# Patient Record
Sex: Male | Born: 1990 | Race: Black or African American | Hispanic: No | Marital: Single | Smoking: Former smoker
Health system: Southern US, Community
[De-identification: ages and names within clinical notes are randomized; demographics above are authoritative.]

## PROBLEM LIST (undated history)

## (undated) DIAGNOSIS — B20 Human immunodeficiency virus [HIV] disease: Secondary | ICD-10-CM

## (undated) DIAGNOSIS — H409 Unspecified glaucoma: Secondary | ICD-10-CM

## (undated) DIAGNOSIS — F329 Major depressive disorder, single episode, unspecified: Secondary | ICD-10-CM

## (undated) HISTORY — DX: Human immunodeficiency virus (HIV) disease: B20

## (undated) HISTORY — DX: Unspecified glaucoma: H40.9

## (undated) HISTORY — PX: NO PAST SURGERIES: SHX2092

## (undated) HISTORY — DX: Major depressive disorder, single episode, unspecified: F32.9

---

## 2014-05-13 ENCOUNTER — Emergency Department (HOSPITAL_COMMUNITY)
Admission: EM | Admit: 2014-05-13 | Discharge: 2014-05-13 | Discharge status: Absent without leave | Payer: Self-pay | Source: Home / Self Care

## 2016-09-22 ENCOUNTER — Other Ambulatory Visit: Payer: Self-pay | Admitting: Infectious Diseases

## 2016-09-22 ENCOUNTER — Ambulatory Visit (INDEPENDENT_AMBULATORY_CARE_PROVIDER_SITE_OTHER): Payer: Self-pay | Admitting: Licensed Clinical Social Worker

## 2016-09-22 ENCOUNTER — Encounter: Payer: Self-pay | Admitting: Infectious Diseases

## 2016-09-22 ENCOUNTER — Ambulatory Visit (INDEPENDENT_AMBULATORY_CARE_PROVIDER_SITE_OTHER): Payer: Self-pay | Admitting: Infectious Diseases

## 2016-09-22 VITALS — BP 136/88 | HR 69 | Temp 98.4°F | Ht 73.0 in

## 2016-09-22 DIAGNOSIS — F4321 Adjustment disorder with depressed mood: Secondary | ICD-10-CM

## 2016-09-22 DIAGNOSIS — B20 Human immunodeficiency virus [HIV] disease: Secondary | ICD-10-CM

## 2016-09-22 DIAGNOSIS — F329 Major depressive disorder, single episode, unspecified: Secondary | ICD-10-CM

## 2016-09-22 DIAGNOSIS — Z113 Encounter for screening for infections with a predominantly sexual mode of transmission: Secondary | ICD-10-CM

## 2016-09-22 DIAGNOSIS — Z0000 Encounter for general adult medical examination without abnormal findings: Secondary | ICD-10-CM

## 2016-09-22 DIAGNOSIS — F32A Depression, unspecified: Secondary | ICD-10-CM

## 2016-09-22 HISTORY — DX: Depression, unspecified: F32.A

## 2016-09-22 HISTORY — DX: Human immunodeficiency virus (HIV) disease: B20

## 2016-09-22 LAB — CBC WITH DIFFERENTIAL/PLATELET
BASOS ABS: 34 {cells}/uL (ref 0–200)
Basophils Relative: 1 %
EOS ABS: 204 {cells}/uL (ref 15–500)
EOS PCT: 6 %
HCT: 45.7 % (ref 38.5–50.0)
Hemoglobin: 15.8 g/dL (ref 13.2–17.1)
LYMPHS PCT: 38 %
Lymphs Abs: 1292 cells/uL (ref 850–3900)
MCH: 31.5 pg (ref 27.0–33.0)
MCHC: 34.6 g/dL (ref 32.0–36.0)
MCV: 91 fL (ref 80.0–100.0)
MONOS PCT: 10 %
MPV: 9.4 fL (ref 7.5–12.5)
Monocytes Absolute: 340 cells/uL (ref 200–950)
NEUTROS ABS: 1530 {cells}/uL (ref 1500–7800)
Neutrophils Relative %: 45 %
PLATELETS: 286 10*3/uL (ref 140–400)
RBC: 5.02 MIL/uL (ref 4.20–5.80)
RDW: 13.3 % (ref 11.0–15.0)
WBC: 3.4 10*3/uL — ABNORMAL LOW (ref 3.8–10.8)

## 2016-09-22 NOTE — Progress Notes (Signed)
Patient Active Problem List   Diagnosis Date Noted  . HIV (human immunodeficiency virus infection) ([place]) 09/22/2016  . Healthcare maintenance 09/22/2016  . Depression 09/22/2016    Patient's Medications   No medications on file    Subjective: Eric [name] is here today for his first visit for HIV care.   HIV = This is a new diagnosis for him. Received + Ab at [facility] on 08/31/16. He has had a very difficult time accepting this is an actual diagnosis and he is still hoping that it is false. Has been very tearful since receiving this news and withdrawn. Currently living with a few roommates, has family locally and has kept diagnosis to himself. Reports he had a negative test in Feb 2017; has had urinary symptoms lately that led him to recent STI screening. Repots no IVDU. 24 male sexual partners but has in the past engaged in oral sex with male partners but never insertive/receptive anal intercourse. Denies associated symptoms including fevers, chills, headaches, weight loss, SOB, abdominal pain / cramping, diarrhea, lymphadenopathy. Does recall about 4 months ago having had a flu-like illness that resolved.   Health Maintenance = reports he is UTD on childhood vaccines. 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   Review of Systems: Review of Systems  Constitutional: Negative for chills, fever, malaise/fatigue and weight loss.  HENT: Negative for sore throat.   Respiratory: Negative for cough, sputum production and shortness of breath.   Cardiovascular: Negative.   Gastrointestinal: Negative for abdominal pain, diarrhea and vomiting.  Musculoskeletal: Negative for joint pain, myalgias and neck pain.  Skin: Negative for rash.  Neurological: Negative for headaches.  Psychiatric/Behavioral: Positive for depression. Negative for substance abuse and suicidal ideas. The patient is not nervous/anxious.     Past Medical History:  Diagnosis Date  .  Depression 09/22/2016  . Glaucoma    currently untreated   . HIV (human immunodeficiency virus infection) ([place]) 09/22/2016    Social History  Substance Use Topics  . Smoking status: Never Smoker  . Smokeless tobacco: Never Used  . Alcohol use Yes     Comment: social    Family History  Problem Relation Age of Onset  . Hypertension Father   . Diabetes Father   . Healthy Brother   . Hypertension Maternal Grandmother   . Kidney disease Maternal Grandmother     Not on File   No current outpatient prescriptions on file.  Objective: Physical Exam  Constitutional: He is oriented to person, place, and time and well-developed, well-nourished, and in no distress.  Tearful throughout the visit.   HENT:  Mouth/Throat: No oral lesions. Normal dentition. No dental caries. No oropharyngeal exudate.  Eyes: No scleral icterus.  Cardiovascular: Normal rate, regular rhythm and normal heart sounds.   Pulmonary/Chest: Effort normal and breath sounds normal.  Abdominal: Soft. He exhibits no distension. There is no tenderness.  Lymphadenopathy:    He has no cervical adenopathy.  Neurological: He is alert and oriented to person, place, and time.  Skin: Skin is warm and dry. No rash noted.  Psychiatric: Mood and affect normal.   Vitals:   09/22/16 1520  BP: 136/88  Pulse: 69  Temp: 98.4 F (36.9 C)     Confirmatory HIV Ab testing reviewed.   I have reviewed all available documents of his medical record.    Assessment and Plan:  Problem List Items Addressed This Visit      Other  HIV (human immunodeficiency virus infection) ([place]) (Chronic)    New patient here to establish for HIV care. I discussed with Bryan Lemma treatment options/side effects, benefits of treatment and long-term outcomes. I discussed how HIV is transmitted and the process of untreated HIV including increased risk for opportunistic infections, cancer, dementia and renal failure. He was counseled on routine HIV  care including medication adherence, blood monitoring, necessary vaccines and follow up visits. Counseled regarding safe sex practices including: condom use, partner disclosure, limiting partners.   He has met with Sharyn Lull for ADAP application. He is not ready to start on ART at this time. Will check labs today and have him come back in 3 weeks for review and continue education / support. Would consider Biktarvy for him in the future.   I spent greater than 45 minutes with the patient today. Greater than 50% of the time spent face-to-face counseling and coordination of care re: HIV and health maintenance.        Relevant Orders   HIV-1 RNA ultraquant reflex to gentyp+   T-helper cell (CD4)- ([facility] clinic only)   COMPLETE METABOLIC PANEL WITH GFR   CBC with Differential/Platelet   Lipid panel   HLA B*5701   Quantiferon tb gold assay (blood)   Hepatitis A antibody, total   Hepatitis B surface antibody   Hepatitis B surface antigen   Hepatitis C antibody   Healthcare maintenance    Reviewed childhood vaccines - would benefit from HPV vaccine series as well as pneumovax. Will assess Hepatitis A/B immunity as well as CD4 to determine appropriate timing. Counseled today about need for further discussion at later visits.       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 I had him meet with Judeen Hammans today to assist with counseling needs. I will also have him meet with Audelia Hives for further social support. Would benefit from support groups / [facility]. I have offered him the option for medication assistance today or at future visits. Will continue to reassess.        Other Visit Diagnoses    Screening for STDs (sexually transmitted diseases)    -  Primary   Relevant Orders   RPR      Janene Madeira, MSN, NP-C [place] [facility] [place] [facility]  09/22/16 4:34 PM

## 2016-09-22 NOTE — Assessment & Plan Note (Addendum)
This is a new problem for him; reactive depression in light of HIV diagnosis. I had him meet with Cordelia PenSherry today to assist with counseling needs. I will also have him meet with Kelby FamManuel for further social support. Would benefit from support groups / [facility]. I have offered him the option for medication assistance today or at future visits. Will continue to reassess.

## 2016-09-22 NOTE — Assessment & Plan Note (Signed)
New patient here to establish for HIV care. I discussed with Abby Vancott treatment options/side effects, benefits of treatment and long-term outcomes. I discussed how HIV is transmitted and the process of untreated HIV including increased risk for opportunistic infections, cancer, dementia and renal failure. He was counseled on routine HIV care including medication adherence, blood monitoring, necessary vaccines and follow up visits. Counseled regarding safe sex practices including: condom use, partner disclosure, limiting partners.   He has met with Michelle for ADAP application. He is not ready to start on ART at this time. Will check labs today and have him come back in 3 weeks for review and continue education / support. Would consider Biktarvy for him in the future.   I spent greater than 45 minutes with the patient today. Greater than 50% of the time spent face-to-face counseling and coordination of care re: HIV and health maintenance.   

## 2016-09-22 NOTE — Assessment & Plan Note (Signed)
Reviewed childhood vaccines - would benefit from HPV vaccine series as well as pneumovax. Will assess Hepatitis A/B immunity as well as CD4 to determine appropriate timing. Counseled today about need for further discussion at later visits.

## 2016-09-22 NOTE — Addendum Note (Signed)
Addended by: Blanchard KelchIXON, Mazelle Huebert N on: 09/22/2016 04:51 PM   Modules accepted: Orders

## 2016-09-22 NOTE — Patient Instructions (Signed)
Very nice to meet you today.   We will draw some blood work and have you come back in about 3 weeks to meet with Judeth CornfieldStephanie again. Feel free to reach out to me in the mean time should you have any further questions or needs before I see you.   Please sign up with MyChart to access your labs and set up email communication with our clinic for non-urgent medical concerns.

## 2016-09-23 LAB — COMPLETE METABOLIC PANEL WITH GFR
ALBUMIN: 4.3 g/dL (ref 3.6–5.1)
ALK PHOS: 53 U/L (ref 40–115)
ALT: 10 U/L (ref 9–46)
AST: 16 U/L (ref 10–40)
BUN: 11 mg/dL (ref 7–25)
CO2: 24 mmol/L (ref 20–32)
Calcium: 9.3 mg/dL (ref 8.6–10.3)
Chloride: 103 mmol/L (ref 98–110)
Creat: 0.99 mg/dL (ref 0.60–1.35)
GLUCOSE: 83 mg/dL (ref 65–99)
POTASSIUM: 4.2 mmol/L (ref 3.5–5.3)
SODIUM: 138 mmol/L (ref 135–146)
Total Bilirubin: 0.6 mg/dL (ref 0.2–1.2)
Total Protein: 7.3 g/dL (ref 6.1–8.1)

## 2016-09-23 LAB — URINALYSIS
Bilirubin Urine: NEGATIVE
GLUCOSE, UA: NEGATIVE
HGB URINE DIPSTICK: NEGATIVE
LEUKOCYTES UA: NEGATIVE
Nitrite: NEGATIVE
PH: 6 (ref 5.0–8.0)
Protein, ur: NEGATIVE
Specific Gravity, Urine: 1.015 (ref 1.001–1.035)

## 2016-09-23 LAB — LIPID PANEL
CHOLESTEROL: 139 mg/dL (ref <200)
HDL: 34 mg/dL — ABNORMAL LOW (ref >40)
LDL Cholesterol: 89 mg/dL (ref <100)
Total CHOL/HDL Ratio: 4.1 Ratio (ref <5.0)
Triglycerides: 78 mg/dL (ref <150)
VLDL: 16 mg/dL (ref <30)

## 2016-09-23 LAB — RPR

## 2016-09-23 LAB — HEPATITIS C ANTIBODY: HCV AB: NONREACTIVE

## 2016-09-23 LAB — HEPATITIS B SURFACE ANTIBODY,QUALITATIVE: Hep B S Ab: NONREACTIVE

## 2016-09-23 LAB — HEPATITIS B SURFACE ANTIGEN: Hepatitis B Surface Ag: NONREACTIVE

## 2016-09-23 LAB — HEPATITIS A ANTIBODY, TOTAL: Hep A Total Ab: REACTIVE — AB

## 2016-09-23 NOTE — Progress Notes (Signed)
Integrated Behavioral Health Initial Visit  MRN: [id number] Name: Eric [name]   Session Start time: 4:10 pm Session End time: 4:45 pm Total time: 35 minutes  Type of Service: Integrated Behavioral Health- Individual/Family Interpretor:No. Interpretor Name and Language: N/A   Warm Hand Off Completed.       SUBJECTIVE: Eric GoadLee Laursen is a 26 y.o. male accompanied by patient. Patient was referred by Rexene AlbertsStephanie Dixon for difficulty adjusting to new diagnosis.  Patient reports the following symptoms/concerns: Patient reported that he found out about his diagnosis last week and until his appointment today was in denial telling himself that he was misdiagnosed. Patient reported being in shock and disbelief hearing the provider confirm the diagnosis. Patient denied telling anyone his HIV status and denied wanting to inform family or friends.  [facility] introduced the HIV support group as a way to gain additional support and education and encouraged patient to attend.  [facility] also attempted to provide hope to the patient by providing education about the role of medications extending HIV lifespan.  Patient was receptive to the need to increase social support and challenge thinking that HIV is a death sentence by speaking to those living with HIV.  Patient was receptive also to the need to increase medical education about HIV to assist in initiating a dialogue with others and to provide hope.  Patient reported that he initially thought of ending his life when first informed of his HIV status, but reported using the defense mechanism of denial to lessen ideations.  Currently the patient denied suicidal ideations, plan, or intent and stated, "I wont kill myself".  Patient is receptive to receiving mental health services and was eager to begin this week for additional support.  Duration of problem: 1 week; Severity of problem: moderate  OBJECTIVE: Mood: Depressed and Affect: Tearful Risk of harm to self or  others: No plan to harm self or others  Thought process: coherent Thought content: logical  ASSESSMENT: Patient is currently experiencing difficulty adjusting to his HIV diagnosis and may benefit from mental health counseling, participation in a support group, and additional social support.  LIFE CONTEXT: Family and Social: Patient lives with two roommates and the bulk of patient's family members live in [place]. School/Work: Patient has recently graduated from college and has a OncologistBachelor's and master's degree.  Patient is currently interning. Self-Care: Patient is able to tend to his ADL's but is hopeless regarding his HIV status and presents with poor motivation for compliance to treatment regimen. Life Changes: Patient is newly diagnosed and is having difficulty accepting his diagnosis.  GOALS ADDRESSED: Patient will increase adjustment to diagnosis, decrease depressive symptoms, and increase knowledge and/or ability of: coping skills and self-management skills and also: Increase healthy adjustment to current life circumstances, Increase adequate support systems for patient/family and Increase motivation to adhere to plan of care  INTERVENTIONS: Supportive Counseling and Psychoeducation and/or Health Education  PLAN: 1. Follow up with behavioral health clinician on : Thursday August 16th or Friday August 17th 2. Behavioral recommendations: Begin to increase social support and attend support group 3. Referral(s): HIV support group, [facility], [facility]   Vergia AlbertsSherry Persephanie Laatsch, LPC

## 2016-09-24 ENCOUNTER — Telehealth: Payer: Self-pay | Admitting: Licensed Clinical Social Worker

## 2016-09-24 LAB — HIV-1 RNA,QN PCR W/REFLEX GENOTYPE
HIV-1 RNA, QN PCR: 4.9 Log cps/mL — ABNORMAL HIGH
HIV-1 RNA, QN PCR: 79600 Copies/mL — ABNORMAL HIGH

## 2016-09-24 LAB — QUANTIFERON TB GOLD ASSAY (BLOOD)
INTERFERON GAMMA RELEASE ASSAY: NEGATIVE
MITOGEN-NIL SO: 8.19 [IU]/mL
QUANTIFERON NIL VALUE: 0.05 [IU]/mL
QUANTIFERON TB AG MINUS NIL: 0.01 [IU]/mL

## 2016-09-24 LAB — T-HELPER CELL (CD4) - (RCID CLINIC ONLY)
CD4 T CELL ABS: 300 /uL — AB (ref 400–2700)
CD4 T CELL HELPER: 23 % — AB (ref 33–55)

## 2016-09-24 NOTE — Telephone Encounter (Signed)
Spoke with patient about why he did not come in to see [facility] and patient reported that he "just wanted to get away from talking about it'.  Patient reported that he is safe but needed time to continue to process the diagnosis information.  Patient was agreeable with scheduling appointment for Monday at 10:15 am.  [name], [facility]

## 2016-09-27 ENCOUNTER — Ambulatory Visit (INDEPENDENT_AMBULATORY_CARE_PROVIDER_SITE_OTHER): Payer: Self-pay | Admitting: Licensed Clinical Social Worker

## 2016-09-27 DIAGNOSIS — F4321 Adjustment disorder with depressed mood: Secondary | ICD-10-CM

## 2016-09-27 NOTE — Progress Notes (Signed)
Integrated Behavioral Health Follow Up Visit  MRN: [id number] Name: Eric [name]   Session Start time: 10:20 am Session End time: 11:28 am Total time: 1 hour and 8 mins Number of Integrated Behavioral Health Clinician visits: 2/10  Type of Service: Integrated Behavioral Health- Individual/Family Interpretor:No. Interpretor Name and Language: N/A   Warm Hand Off Completed.       SUBJECTIVE: Eric [name] is a 26 y.o. male accompanied by patient. Patient was referred by Eric [name] for difficulty adjusting to new diagnosis.  Patient reported that he is in a different "head space" than he was last week but denied fully accepting his HIV diagnosis.  Patient reported that he knows that he has HIV but attempts to push the thought out of his head to function.  Patient was receptive to alternate ways to think about his diagnosis to reduce psychic pain.  Patient was able to state personal goals for the next  5 years and related his life's "purpose" to his diagnosis.  Patient reported that he shared his HIV status with a close cousin and felt supported by her. Patient verbalized relief that she did not judge him but joined in on trying to provide hope about his future.  Patient was receptive to being selective about sharing his diagnosis with those he can trust, in his timing.    Patient presented with lots of medical questions and was receptive to seeking answers from his medical provider and pharmacy at his appointment next week.  Patient was anxious to begin taking medications and being treatment compliant and stated, "I'm for taking them". Patient processed what taking medications for the rest of his life meant to him as well as having a future wife and family.  Patient was receptive to discussing healthy lifestyle behaviors and prevention of re-infection.  Duration of problem: 1 week; Severity of problem: moderate  OBJECTIVE: Behavior: smiling Mood: Euthymic and Affect:  Appropriate Risk of harm to self or others: No plan to harm self or others  Thought process: coherent Thought content: logical  ASSESSMENT: Patient is currently experiencing difficulty adjusting to his HIV diagnosis and may benefit from mental health counseling, participation in a support group, and additional social support.  Patient is making strides in attempting to accept and integrate diagnosis and make meaning.  At today's session the patient reported an increase in hope and adjustment to diagnosis, based on education provided.  Patient reported increase in life satisfaction knowing that HIV can be managed with medications, and that he can still pursue his life goals and desires.  GOALS ADDRESSED: Patient will increase adjustment to new diagnosis, become educated about HIV, decrease depressive symptoms and increase knowledge and/or ability of: coping skills and self-management skills and also: Increase healthy adjustment to current life circumstances, Increase adequate support systems for patient/family and Increase motivation to adhere to plan of care  INTERVENTIONS: Supportive Counseling and Psychoeducation and/or Health Education Educated patient on ways to reframe diagnosis to refocus on promoting optimal health.  Educated on role of HIV medications in suppressing virus growth and combating the virus.  Related compliant use of medications to future goals to increase hope for the future.  Encouraged increasing social support.  Processed utilizing better judgment to increase healthy lifestyles, planning, and prevention.  PLAN: 1. Follow up with behavioral health clinician on : Tues Aug 28th at 3 pm 2. Behavioral recommendations: Continue to increase education about HIV and ask questions at medical appointments. 3. At next session [facility] will continue to address  adjustment and advocacy and provide the patient with a lab values diary to track medications, CD4 and viral load amounts.  Eric [name], [facility]

## 2016-09-29 LAB — HLA B*5701: HLA-B 5701 W/RFLX HLA-B HIGH: NEGATIVE

## 2016-10-01 ENCOUNTER — Encounter: Payer: Self-pay | Admitting: Infectious Diseases

## 2016-10-05 ENCOUNTER — Ambulatory Visit (INDEPENDENT_AMBULATORY_CARE_PROVIDER_SITE_OTHER): Payer: Self-pay | Admitting: Licensed Clinical Social Worker

## 2016-10-05 ENCOUNTER — Ambulatory Visit (INDEPENDENT_AMBULATORY_CARE_PROVIDER_SITE_OTHER): Payer: Self-pay | Admitting: Infectious Diseases

## 2016-10-05 ENCOUNTER — Encounter (INDEPENDENT_AMBULATORY_CARE_PROVIDER_SITE_OTHER): Payer: Self-pay

## 2016-10-05 VITALS — BP 133/79 | HR 65 | Temp 98.7°F | Wt 222.0 lb

## 2016-10-05 DIAGNOSIS — F4321 Adjustment disorder with depressed mood: Secondary | ICD-10-CM

## 2016-10-05 DIAGNOSIS — F329 Major depressive disorder, single episode, unspecified: Secondary | ICD-10-CM

## 2016-10-05 DIAGNOSIS — B20 Human immunodeficiency virus [HIV] disease: Secondary | ICD-10-CM

## 2016-10-05 MED ORDER — BICTEGRAVIR-EMTRICITAB-TENOFOV 50-200-25 MG PO TABS: 1.0000 | ORAL_TABLET | Freq: Every day | ORAL | 5 refills | Status: DC

## 2016-10-05 NOTE — Assessment & Plan Note (Signed)
Have discussed options for starting medications for his depression today. He will continue to think about it. I have encouraged him to continue working with [facility] outpatient. Also discussed reputable sites online to seek out information about HIV as YouTube sites may not be the most encouraging for him right now. Denies SI/HI but seems pretty hopeless today and uncertain about how this will impact his future.

## 2016-10-05 NOTE — Progress Notes (Signed)
Patient Active Problem List   Diagnosis Date Noted  . HIV (human immunodeficiency virus infection) ([place]) 09/22/2016  . Healthcare maintenance 09/22/2016  . Depression 09/22/2016    Patient's Medications  New Prescriptions   BICTEGRAVIR-EMTRICITABINE-TENOFOVIR AF (BIKTARVY) 50-200-25 MG TABS TABLET    Take 1 tablet by mouth daily. Try to take at the same time each day with or without food.  Previous Medications   No medications on file  Modified Medications   No medications on file  Discontinued Medications   No medications on file    Subjective: Tyra Michelle is here today for follow up on HIV infection and review of lab work.   Over last 2 weeks Starr reports he has feel improved until today - assuming it has to do with coming back to the doctor to review labs and plan for HIV care. He reports a little bit of hope for his future but still stricken with grief over diagnosis. He has been working with Judeen Hammans our clinic counselor for support as he does not have much right now - does not wish to share diagnosis. He does not remember much of our conversation last week but is ready to review lab work. Endorses no complaints today suggestive of associated opportunistic infection or advancing HIV disease such as fevers, night sweats, weight loss, anorexia, cough, SOB, nausea, vomiting, diarrhea, headache, sensory changes, lymphadenopathy or oral thrush.   Review of Systems  Constitutional: Negative for chills, fever, malaise/fatigue and weight loss.  HENT: Negative for sore throat.   Respiratory: Negative for cough, sputum production and shortness of breath.   Cardiovascular: Negative.   Gastrointestinal: Negative for abdominal pain, diarrhea and vomiting.  Musculoskeletal: Negative for joint pain, myalgias and neck pain.  Skin: Negative for rash.  Neurological: Negative for headaches.  Psychiatric/Behavioral: Positive for depression. Negative for substance abuse and suicidal ideas.  The patient is not nervous/anxious.     Past Medical History:  Diagnosis Date  . Depression 09/22/2016  . Glaucoma    currently untreated   . HIV (human immunodeficiency virus infection) ([place]) 09/22/2016    Social History  Substance Use Topics  . Smoking status: Never Smoker  . Smokeless tobacco: Never Used  . Alcohol use Yes     Comment: social   Objective: Physical Exam  Constitutional: He is oriented to person, place, and time and well-developed, well-nourished, and in no distress.  He is not tearful today but still withdrawn. Does not prompt his own questions. Very depressed mood.   HENT:  Mouth/Throat: No oral lesions. Normal dentition. No dental caries. No oropharyngeal exudate.  Eyes: No scleral icterus.  Cardiovascular: Normal rate, regular rhythm and normal heart sounds.   Pulmonary/Chest: Effort normal and breath sounds normal.  Abdominal: Soft. He exhibits no distension. There is no tenderness.  Lymphadenopathy:    He has no cervical adenopathy.  Neurological: He is alert and oriented to person, place, and time.  Skin: Skin is warm and dry. No rash noted.  Psychiatric: Mood and affect normal.   Vitals:   10/05/16 1612  BP: 133/79  Pulse: 65  Temp: 98.7 F (37.1 C)    Lab Results Lab Results  Component Value Date   WBC 3.4 (L) 09/22/2016   HGB 15.8 09/22/2016   HCT 45.7 09/22/2016   MCV 91.0 09/22/2016   PLT 286 09/22/2016    Lab Results  Component Value Date   CREATININE 0.99 09/22/2016   BUN 11 09/22/2016   NA 138  09/22/2016   K 4.2 09/22/2016   CL 103 09/22/2016   CO2 24 09/22/2016    Lab Results  Component Value Date   ALT 10 09/22/2016   AST 16 09/22/2016   ALKPHOS 53 09/22/2016   BILITOT 0.6 09/22/2016    Lab Results  Component Value Date   CHOL 139 09/22/2016   HDL 34 (L) 09/22/2016   LDLCALC 89 09/22/2016   TRIG 78 09/22/2016   CHOLHDL 4.1 09/22/2016   CD4 T Cell Abs (/uL)  Date Value  09/22/2016 300 (L)   No results found  for: HIV1GENOSEQ Lab Results  Component Value Date   HAV REACTIVE (A) 09/22/2016   Lab Results  Component Value Date   HEPBSAG NON-REACTIVE 09/22/2016   HEPBSAB NON-REACTIVE 09/22/2016   Lab Results  Component Value Date   HCVAB NON-REACTIVE 09/22/2016    Lab Results  Component Value Date   QUANTGOLD NEGATIVE 09/22/2016    I have reviewed all available documents of his medical record.    Assessment and Plan:  Problem List Items Addressed This Visit      Other   HIV (human immunodeficiency virus infection) ([place]) - Primary (Chronic)    I reviewed all of his lab work today and discussed goals of care with starting ART. Discussed again about HIV transmission and safe sex practices while we get him undetectable. Declined condoms today. He is ready to start medications today - I will start him on Biktarvy to offer simplicity of regimen with high barrier to resistance as he is still very grief stricken with diagnosis - would like to make this as easy on him as possible. He spent time with our pharmacist today, Cassie discussing successful practices with ART adherence. He will follow up with her in 1 month. ADAP pending - has met with Caryl Pina for [place] drug assistance. He will return to see me in 3 months with repeat lab work. No OI proph needed today with CD4 count.       Relevant Medications   bictegravir-emtricitabine-tenofovir AF (BIKTARVY) 50-200-25 MG TABS tablet   Depression    Have discussed options for starting medications for his depression today. He will continue to think about it. I have encouraged him to continue working with [facility] outpatient. Also discussed reputable sites online to seek out information about HIV as YouTube sites may not be the most encouraging for him right now. Denies SI/HI but seems pretty hopeless today and uncertain about how this will impact his future.           Janene Madeira, MSN, NP-C [place] [facility] [place] [facility]  10/05/16 17:45 pm

## 2016-10-05 NOTE — Patient Instructions (Addendum)
Great working with you today!   We are going to start you on a new medication called Biktarvy. You will take this one pill once a day with or with out food.   Common side effects include headaches and diarrhea. You can try over the counter tylenol for headaches and imodium for diarrhea should you need them.   Follow up with Cassie when she scheduled you in 1 month and with Judeth Cornfield again in 3 months with repeat labs a few weeks before.

## 2016-10-05 NOTE — Assessment & Plan Note (Addendum)
I reviewed all of his lab work today and discussed goals of care with starting ART. Discussed again about HIV transmission and safe sex practices while we get him undetectable. Declined condoms today. He is ready to start medications today - I will start him on Biktarvy to offer simplicity of regimen with high barrier to resistance as he is still very grief stricken with diagnosis - would like to make this as easy on him as possible. He spent time with our pharmacist today, Cassie discussing successful practices with ART adherence. He will follow up with her in 1 month. ADAP pending - has met with Caryl Pina for [place] drug assistance. He will return to see me in 3 months with repeat lab work. No OI proph needed today with CD4 count.

## 2016-10-05 NOTE — Progress Notes (Signed)
HPI: Eric [name] is a 26 y.o. male who presents to the [facility] for HIV follow-up.   Allergies: Not on File  Past Medical History: Past Medical History:  Diagnosis Date  . Depression 09/22/2016  . Glaucoma    currently untreated   . HIV (human immunodeficiency virus infection) (HCC) 09/22/2016    Social History: Social History   Social History  . Marital status: Single    Spouse name: N/A  . Number of children: N/A  . Years of education: N/A   Social History Main Topics  . Smoking status: Never Smoker  . Smokeless tobacco: Never Used  . Alcohol use Yes     Comment: social  . Drug use: No  . Sexual activity: No   Other Topics Concern  . Not on file   Social History Narrative  . No narrative on file    Current Regimen: None yet  Labs: CD4 T Cell Abs (/uL)  Date Value  09/22/2016 300 (L)   Hep B S Ab (no units)  Date Value  09/22/2016 NON-REACTIVE   Hepatitis B Surface Ag (no units)  Date Value  09/22/2016 NON-REACTIVE   HCV Ab (no units)  Date Value  09/22/2016 NON-REACTIVE    CrCl: Estimated Creatinine Clearance: 144.8 mL/min (by C-G formula based on SCr of 0.99 mg/dL).  Lipids:    Component Value Date/Time   CHOL 139 09/22/2016 1652   TRIG 78 09/22/2016 1652   HDL 34 (L) 09/22/2016 1652   CHOLHDL 4.1 09/22/2016 1652   VLDL 16 09/22/2016 1652   LDLCALC 89 09/22/2016 1652    Assessment: Eric [name] is here today to follow-up with Eric [name], our ID NP.  He was seen a few weeks ago but was not ready to discuss or start medications to treat his HIV.  He comes back in today and is more acceptable of his diagnosis and is ready to begin taking medications. We will start him on Biktarvy.  I spent some time going over Eric [name] - how to take it, not to miss a dose, and possible side effects associated with the medication. He had a few questions about taking a multivitamin.  Told him to take it with Biktarvy with food on his stomach if he decides to start a  multivitamin.  I gave him my card and told him to call me with any issues.  Eric [name] will help him get a 30 day supply of Biktarvy through [place] while we are waiting on his HMAP to be approved. He will come back and see me in ~3 weeks.  Plans: - Start Biktarvy PO once daily - F/u with me again 9/20 at 1030am  Eric [name], PharmD, CPP Infectious Diseases Clinical Pharmacist [facility] 10/05/2016, 4:40 PM

## 2016-10-06 NOTE — BH Specialist Note (Signed)
Integrated Behavioral Health Follow Up Visit  MRN: [id number] Name: Eric GoadLee Erno   Session Start time: 3:16 pm Session End time: 4:10 pm Total time: 55 minutes Number of Integrated Behavioral Health Clinician visits: 3/10  Type of Service: Integrated Behavioral Health- Individual/Family Interpretor:No. Interpretor Name and Language: N/A   Warm Hand Off Completed.       SUBJECTIVE: Eric [name] is a 26 y.o. male accompanied by patient. Patient was referred by Rexene AlbertsStephanie Dixon for difficulty adjusting to new diagnois.  Patient reported increase in depressed mood due to watching videos of persons with HV on the Internet.  Patient was educated on need to limit sources of information coming from the non-medical field and was receptive.  Patient agreed to pick one topic to research on the Internet, which was becoming undetectable.  Patient denied feeling hopeless but was unable to picture himself in the future, what we would be doing, or who he would have in his life.  Patient reported new experience of general, non-specific thoughts of death that bring relief.  Patient reported recurrent thoughts on the absence of HIV if he were dead, but denied having any specific thoughts, plan, or intent to harm himself.  Patient also verbalized being indecisive about informing his cousin of his diagnosis.  Patient was receptive to processing his distrust which is leading to being indecisive in making decisions.  Patient was receptive to his need to share his diagnosis with her to gain support and was receptive to bringing her along to appointments to receive medical education.  In terms of gaining additional support, the patient reported his hesitancy with coming to the HIV support group due to concerns with privacy.  Patient stated that he would be receptive to an online anonymous support group.  Duration of problem: weeks; Severity of problem: moderate  OBJECTIVE: Behavior: tearful Mood: Depressed and  Affect: within range Risk of harm to self or others: General thoughts of death, with No plan to harm self or others  Thought process: conherent Thought content: logical  ASSESSMENT: Patient is currently experiencing difficulty adjusting to his HIV diagnosis and general thoughts of death, and may benefit from mental health counseling, participation in a support group, and additional social support.  Patient has experienced a set-back to the strides he was making, in attempting to accept and integrate diagnosis and make meaning.    GOALS ADDRESSED: Patient will increase adjustment to new diagnosis, become educated about HIV, decrease depressive symptoms and increase knowledge and/or ability of: coping skills and self-management skills and also: Increase healthy adjustment to current life circumstances, Increase adequate support systems for patient/family and Increase motivation to adhere to plan of care.  INTERVENTIONS: Supportive Counseling and Psychoeducation and/or Health Education Encouraged patient to find hope for his future and to be able to picture himself achieving his goals.  PLAN: 1. Follow up with behavioral health clinician on : Thurs Sept 6th at 10:15 am 2. Behavioral recommendations: Continue to increase education about HIV and ask questions at medical appointments.  Patient can only research information on becoming undetectable on the Internet. 1. [facility] will [facility] to address adjustment and advocacy   Vergia AlbertsSherry Ceili Boshers, [facility]

## 2016-10-14 ENCOUNTER — Ambulatory Visit: Payer: Self-pay | Admitting: Licensed Clinical Social Worker

## 2016-10-28 ENCOUNTER — Ambulatory Visit (INDEPENDENT_AMBULATORY_CARE_PROVIDER_SITE_OTHER): Payer: Self-pay | Admitting: Pharmacist

## 2016-10-28 ENCOUNTER — Ambulatory Visit (INDEPENDENT_AMBULATORY_CARE_PROVIDER_SITE_OTHER): Payer: Self-pay | Admitting: Licensed Clinical Social Worker

## 2016-10-28 DIAGNOSIS — B20 Human immunodeficiency virus [HIV] disease: Secondary | ICD-10-CM

## 2016-10-28 DIAGNOSIS — F4321 Adjustment disorder with depressed mood: Secondary | ICD-10-CM

## 2016-10-28 NOTE — Progress Notes (Signed)
HPI: Eric [name] is a 26 y.o. male who presents to the [facility] for follow-up of his HIV infection.   Allergies: Not on File  Past Medical History: Past Medical History:  Diagnosis Date  . Depression 09/22/2016  . Glaucoma    currently untreated   . HIV (human immunodeficiency virus infection) (HCC) 09/22/2016    Social History: Social History   Social History  . Marital status: Single    Spouse name: N/A  . Number of children: N/A  . Years of education: N/A   Social History Main Topics  . Smoking status: Never Smoker  . Smokeless tobacco: Never Used  . Alcohol use Yes     Comment: social  . Drug use: No  . Sexual activity: No   Other Topics Concern  . Not on file   Social History Narrative  . No narrative on file    Current Regimen: Biktarvy  Labs: CD4 T Cell Abs (/uL)  Date Value  09/22/2016 300 (L)   Hep B S Ab (no units)  Date Value  09/22/2016 NON-REACTIVE   Hepatitis B Surface Ag (no units)  Date Value  09/22/2016 NON-REACTIVE   HCV Ab (no units)  Date Value  09/22/2016 NON-REACTIVE    CrCl: CrCl cannot be calculated (Patient's most recent lab result is older than the maximum 21 days allowed.).  Lipids:    Component Value Date/Time   CHOL 139 09/22/2016 1652   TRIG 78 09/22/2016 1652   HDL 34 (L) 09/22/2016 1652   CHOLHDL 4.1 09/22/2016 1652   VLDL 16 09/22/2016 1652   LDLCALC 89 09/22/2016 1652    Assessment: Eric [name] is here today to follow-up for his HIV infection. He was newly diagnosed back in August and saw Loyal and I to start medications.  He was hesitant at first, but then agreed to start [facility]  He is doing very well on the medication.  He hasn't missed any doses and states he tries to take it at noon every day during his break at work but sometimes he is busy and does not take it until he gets home at 5pm.  I asked if it would be easier for him to switch to taking it when he gets home every day and he said yes.  So, he will do that. I gave him a pill box key chain just in case. He isn't having any side effects at all - no headaches, nausea, vomiting, diarrhea.  He is doing very well.  We will get labs today and make an appointment for him to follow-up with [facility]. His ADAP is approved now, so he will get his next refill at [facility] on [address] [place].  Plans: - Continue Biktarvy PO once daily - HIV RNA today - F/u with Judeth Cornfield 11/19 at 945am  Da Authement L. Bailley Eric, PharmD, CPP Infectious Diseases Clinical Pharmacist [facility] 10/28/2016, 11:09 AM

## 2016-10-29 LAB — HIV-1 GENOTYPE: HIV-1 Genotype: DETECTED — AB

## 2016-11-01 LAB — HIV-1 RNA QUANT-NO REFLEX-BLD
HIV 1 RNA Quant: 115 copies/mL — ABNORMAL HIGH
HIV-1 RNA QUANT, LOG: 2.06 {Log_copies}/mL — AB

## 2016-11-03 NOTE — BH Specialist Note (Signed)
Integrated Behavioral Health Follow Up Visit  MRN: [id number] Name: Eric [name]  Number of Integrated Behavioral Health Clinician visits: 4/6 Session Start time: 11:15 am  Session End time: 12:30 pm Total time: 1 hour 15 mins  Type of Service: Integrated Behavioral Health- Individual/Family Interpretor:No. Interpretor Name and Language: N/A  SUBJECTIVE: Eric [name] is a 26 y.o. male accompanied by self Patient was referred by [name] for difficulty adjusting to new diagnosis.  Patient reviewed symptom of general thoughts of death and reported no change in reduction or increase of symptom, but reported better management with use of coping skills.  Patient stated, "I'm trying to deal with it and accept it".  Patient was open to exploring his history of depressive symptoms before his diagnosis and denied experience except when he could not find employment after his graduation.  [facility] educated patient on the stages of grief, related to his diagnosis, and helped the patient to identify the stage that best fits his current functioning.  Patient identified with being in the Acceptance stage currently but was receptive to fact that stages are not finite, linear or concrete.  Patient also presented with insight that he did not experience the anger stage outwardly but internalized anger.  Patient was receptive to processing internalized anger and relating to depression.  Patient was receptive to participating in an activity to assess his self-image versus what he projects to the world.  Patient presented with personal insight into his traits and characteristics and was able to identify emotional detachment and being emotionally unavailable.  Patient was open to explore the protective nature of this functioning and the restrictions and how it interferes with development of relationships.    Duration of problem: several weeks; Severity of problem: moderate  OBJECTIVE: Mood: Anxious and Depressed  and Affect: Appropriate Risk of harm to self or others: No plan to harm self or others  Thought process: coherent Thought content: logical  ASSESSMENT: Patient is currently experiencing adjustment to his diagnosis and the ability to have hope for the future.  Patient presents as receptive to change and reports increased life satisfaction.  Patient's personal insight has increased into exploring the consequences of closing off his emotions and attachment to others.   Patient may benefit from mental health counseling, participation in a support group, and additional social support.  GOALS ADDRESSED: Patient will increase adjustment to new diagnosis, become educated about HIV, decrease depressive symptoms and increase knowledge and/or ability of: coping skills and self-management skillsand also: Increase healthy adjustment to current life circumstances, Increase adequate support systems for patient/family and Increase motivation to adhere to plan of care.  INTERVENTIONS: Interventions utilized:  Supportive Counseling and Psychoeducation and/or Health Education  PLAN: 1. Follow up with behavioral health clinician on : Thurs Sept 27th at 10:15 am 2. Behavioral recommendations: Integrate all new psychoeducation and utilize coping strategies   [name], [facility]

## 2016-11-04 ENCOUNTER — Telehealth: Payer: Self-pay | Admitting: Licensed Clinical Social Worker

## 2016-11-04 ENCOUNTER — Encounter: Payer: Self-pay | Admitting: Infectious Diseases

## 2016-11-04 ENCOUNTER — Ambulatory Visit: Payer: Self-pay | Admitting: Licensed Clinical Social Worker

## 2016-11-04 NOTE — Telephone Encounter (Signed)
Patient called to report that his car would not start and he needed to reschedule the appointment.  It was rescheduled to [facility] 10/1.  [name], [place]

## 2016-11-08 ENCOUNTER — Ambulatory Visit: Payer: Self-pay | Admitting: Licensed Clinical Social Worker

## 2016-11-08 LAB — HIV-1 GENOTYPE: HIV-1 Genotype: DETECTED — AB

## 2016-11-11 ENCOUNTER — Ambulatory Visit (INDEPENDENT_AMBULATORY_CARE_PROVIDER_SITE_OTHER): Payer: Self-pay | Admitting: Licensed Clinical Social Worker

## 2016-11-11 DIAGNOSIS — F4321 Adjustment disorder with depressed mood: Secondary | ICD-10-CM

## 2016-11-12 NOTE — BH Specialist Note (Signed)
Integrated Behavioral Health Follow Up Visit  MRN: [id number] Name: Eric [name]  Number of Integrated Behavioral Health Clinician visits: 5/6 Session Start time: 1:40 pm  Session End time: 2:40 pm Total time: 1 hour  Type of Service: Integrated Behavioral Health- Individual/Family Interpretor:No. Interpretor Name and Language: N/A  SUBJECTIVE: Eric [name] is a 26 y.o. male accompanied by self Patient was referred by [name] for difficulty adjusting to new diagnosis.  Patient and [facility] processed termination of services and [facility] introduced new counselor and attempted to schedule for continuation of services.  Patient reported that he will think about it but mainly felt he will not engage in therapeutic services after the 6th session.    Patient engaged in active discussion of self-esteem and patient was able to list the components of health self-esteem and whether he can personally have healthy, high self-esteem with HIV.  Patient explored aspects of HIV that impact his self-esteem and stated that his hope "is in prison" and he can't figure out how to get it out.  With assistance, the patient was able to create positive affirmations to use to increase hope.  [facility] commented on patient's mood lability and patient explored his "good days and bad days".   [facility] re-engaged in discussion of the benefits of antidepressant medications and the patient again verbalized his reluctance to take medications but reported that he will think about it.  Duration of problem: since August 2018; Severity of problem: moderate  OBJECTIVE: Mood: Depressed and Affect: Blunt Risk of harm to self or others: No plan to harm self or others  Thought process: coherent Thought content: logical  ASSESSMENT: Patient is currently experiencing difficulty with his self-image, self-esteem, and mood due to having HIV.  Patient's anxiety has increased due to him looking up HIV-related material on the Internet.  Patient may  benefit from reducing his research on the Internet and continuing behavioral health services.  GOALS ADDRESSED: Patient will increase adjustment to new diagnosis, become educated about HIV, decrease depressive symptoms and increase knowledge and/or ability of: coping skills and self-management skillsand also: Increase healthy adjustment to current life circumstances, Increase adequate support systems for patient/family and Increase motivation to adhere to plan of care.  INTERVENTIONS: Interventions utilized:  Brief CBT and Psychoeducation and/or Health Education  PLAN: 1. Follow up with behavioral health clinician on : Monday Oct 15th at 10:15 am Last session 2. Behavioral recommendations: Integrate all new psychoeducation and utilize coping strategies.  Patient will also think on whether to schedule appointment with new counselor and continue behavioral health services.   [name], [facility]

## 2016-11-22 ENCOUNTER — Ambulatory Visit (INDEPENDENT_AMBULATORY_CARE_PROVIDER_SITE_OTHER): Payer: Self-pay | Admitting: Licensed Clinical Social Worker

## 2016-11-22 DIAGNOSIS — F4321 Adjustment disorder with depressed mood: Secondary | ICD-10-CM

## 2016-11-23 NOTE — BH Specialist Note (Signed)
Integrated Behavioral Health Follow Up Visit  MRN: [id number] Name: Eric [name]  Number of Integrated Behavioral Health Clinician visits: 6/6 Session Start time: 10:29 am  Session End time: 12:00 pm Total time: 1 hour, 30 mins  Type of Service: Integrated Behavioral Health- Individual/Family Interpretor:No. Interpretor Name and Language: N/A  SUBJECTIVE: Eric [name] is a 26 y.o. male accompanied by self Patient was referred by [name] for difficulty adjusting to new diagnosis.  Patient and [facility] addressed termination of services.  Patient reported that he does not have insurance so he was not willing to meet with new therapist to continue receiving behavioral health services because he cannot afford cash payment.  Patient also reported that he was not willing to take antidepressant medications, patient stated, "I'm good".  Patient reported that he is still looking up material on the Internet related to a cure for HIV.  Patient verbalized that he is still working on acceptance of having HIV and managing daily thoughts about how he contracted the virus.  Patient has been presenting with obsessive thoughts and behaviors related to the virus.  Patient presented with limited understanding of what being undetectable means and was receptive to recommendation made by [facility] to seek medical education.  Patient and [facility] discussed dating and patient denied that he is currently dating.  Patient was receptive to the human nature of having sexual drives and urges and patient verbalized difficulty dating and being sexual due to the virus.  Patient also shared having sexual attraction and "impulses" for male and male genders, but presented as conflicted.  Patient explored and verbalized his belief system that make sexual attraction to men difficult for him to accept.  Patient was willing to evaluate the core belief system he grew up in to see if it matches his current belief system.  At the end of the  session while participating in a safe space visualization, patient expressed unresolved grief regarding his mother's death, which led to catharsis.  Patient verbalized that he was 18 at the time of his mother's death and he was pushed to return to school and be strong and was not aware of having unresolved grief.  [facility] educated patient on the need to address unresolved grief in grief counseling and patient was receptive.  Duration of problem: since August 2018; Severity of problem: moderate  OBJECTIVE: Mood: Anxious and sad and Affect: Tearful and within range Risk of harm to self or others: No plan to harm self or others  Thought process: coherent Thought content: logical  ASSESSMENT: Patient has made some improvement in adjusting to his diagnosis but is also struggling with anxiety and obsessive thoughts and behaviors related to looking up HIV-related material on the Internet.  Additionally the patient is experiencing internal conflict regarding sexual attraction to men.  Patient may benefit from medical education on being undetectable, discovering congruence with his core belief system, and learning coping strategies to manage obsessive behaviors.   GOALS ADDRESSED: Patient will increase adjustment to new diagnosis, become educated about HIV, decrease depressive symptoms and increase knowledge and/or ability of: coping skills and self-management skillsand also: Increase healthy adjustment to current life circumstances, Increase adequate support systems for patient/family and Increase motivation to adhere to plan of care.  INTERVENTIONS: Interventions utilized:  Brief CBT, Supportive Counseling and Psychoeducation and/or Health Education Addressed healthy sexual development and exploration of drives and urges.  Explored being sexually fluid.  Evaluated patient's core belief system that make same sex sexual impulses difficult to accept.  Participated in safe space visualization.  PLAN: 1. [facility]  will research financial options for patient to continue behavioral health services and make contact with patient. 2. Behavioral recommendations: Patient will evaluate his core beliefs, integrate all new psychoeducation, and utilize coping strategies.  3. Referral: Grief counseling at the [facility] [place], [place]

## 2016-12-01 ENCOUNTER — Telehealth: Payer: Self-pay | Admitting: Licensed Clinical Social Worker

## 2016-12-01 NOTE — Telephone Encounter (Signed)
[facility] called [facility] and left a message with available days and times.  Requested return call with preferred date to arrange for appointment.  Vergia AlbertsSherry Sarayu Prevost, [facility]

## 2016-12-06 ENCOUNTER — Telehealth: Payer: Self-pay | Admitting: Licensed Clinical Social Worker

## 2016-12-06 NOTE — Telephone Encounter (Signed)
[facility] and [facility] for appointment for Wednesday.  Eric AlbertsSherry Tommie Bohlken, [facility]

## 2016-12-06 NOTE — Telephone Encounter (Signed)
Pt left message seeking appointment.  Eric AlbertsSherry Verneta [name], [facility]

## 2016-12-08 ENCOUNTER — Telehealth: Payer: Self-pay | Admitting: Licensed Clinical Social Worker

## 2016-12-08 ENCOUNTER — Ambulatory Visit: Payer: Self-pay | Admitting: Licensed Clinical Social Worker

## 2016-12-08 NOTE — Telephone Encounter (Signed)
[facility] today.  Patient stated that he sent email to outdated email address stating that he could not attend today.  Patient rescheduled appointment to Monday next week.  Eric AlbertsSherry Neoma Uhrich, [facility]

## 2016-12-13 ENCOUNTER — Ambulatory Visit (INDEPENDENT_AMBULATORY_CARE_PROVIDER_SITE_OTHER): Payer: Self-pay | Admitting: Licensed Clinical Social Worker

## 2016-12-13 DIAGNOSIS — F4321 Adjustment disorder with depressed mood: Secondary | ICD-10-CM
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
## 2016-12-17 ENCOUNTER — Encounter: Payer: Self-pay | Admitting: Infectious Diseases

## 2016-12-20 ENCOUNTER — Other Ambulatory Visit: Payer: Self-pay

## 2016-12-20 ENCOUNTER — Emergency Department (HOSPITAL_COMMUNITY)
Admission: EM | Admit: 2016-12-20 | Discharge: 2016-12-20 | Disposition: A | Discharge status: Home / Self Care | Payer: Self-pay | Attending: Emergency Medicine | Admitting: Emergency Medicine

## 2016-12-20 ENCOUNTER — Encounter (HOSPITAL_COMMUNITY): Payer: Self-pay | Admitting: Emergency Medicine

## 2016-12-20 ENCOUNTER — Emergency Department (HOSPITAL_COMMUNITY): Payer: Self-pay

## 2016-12-20 DIAGNOSIS — J90 Pleural effusion, not elsewhere classified: Secondary | ICD-10-CM | POA: Diagnosis present

## 2016-12-20 DIAGNOSIS — Z79899 Other long term (current) drug therapy: Secondary | ICD-10-CM | POA: Insufficient documentation

## 2016-12-20 DIAGNOSIS — I959 Hypotension, unspecified: Secondary | ICD-10-CM | POA: Diagnosis present

## 2016-12-20 DIAGNOSIS — J189 Pneumonia, unspecified organism: Secondary | ICD-10-CM

## 2016-12-20 DIAGNOSIS — Z23 Encounter for immunization: Secondary | ICD-10-CM

## 2016-12-20 DIAGNOSIS — J181 Lobar pneumonia, unspecified organism: Secondary | ICD-10-CM | POA: Insufficient documentation

## 2016-12-20 DIAGNOSIS — E875 Hyperkalemia: Secondary | ICD-10-CM | POA: Diagnosis not present

## 2016-12-20 DIAGNOSIS — N179 Acute kidney failure, unspecified: Secondary | ICD-10-CM | POA: Diagnosis present

## 2016-12-20 DIAGNOSIS — F329 Major depressive disorder, single episode, unspecified: Secondary | ICD-10-CM | POA: Diagnosis present

## 2016-12-20 DIAGNOSIS — Z8249 Family history of ischemic heart disease and other diseases of the circulatory system: Secondary | ICD-10-CM

## 2016-12-20 DIAGNOSIS — Z21 Asymptomatic human immunodeficiency virus [HIV] infection status: Secondary | ICD-10-CM | POA: Diagnosis present

## 2016-12-20 DIAGNOSIS — H409 Unspecified glaucoma: Secondary | ICD-10-CM | POA: Diagnosis present

## 2016-12-20 DIAGNOSIS — A409 Streptococcal sepsis, unspecified: Principal | ICD-10-CM | POA: Diagnosis present

## 2016-12-20 DIAGNOSIS — Y95 Nosocomial condition: Secondary | ICD-10-CM | POA: Diagnosis present

## 2016-12-20 DIAGNOSIS — E860 Dehydration: Secondary | ICD-10-CM | POA: Diagnosis present

## 2016-12-20 DIAGNOSIS — J188 Other pneumonia, unspecified organism: Secondary | ICD-10-CM | POA: Diagnosis present

## 2016-12-20 LAB — CBC WITH DIFFERENTIAL/PLATELET
Basophils Absolute: 0 10*3/uL (ref 0.0–0.1)
Basophils Relative: 0 %
Eosinophils Absolute: 0 10*3/uL (ref 0.0–0.7)
Eosinophils Relative: 0 %
HCT: 39 % (ref 39.0–52.0)
Hemoglobin: 13.6 g/dL (ref 13.0–17.0)
LYMPHS ABS: 1.2 10*3/uL (ref 0.7–4.0)
Lymphocytes Relative: 10 %
MCH: 31.6 pg (ref 26.0–34.0)
MCHC: 34.9 g/dL (ref 30.0–36.0)
MCV: 90.7 fL (ref 78.0–100.0)
Monocytes Absolute: 1.4 10*3/uL — ABNORMAL HIGH (ref 0.1–1.0)
Monocytes Relative: 11 %
Neutro Abs: 10.1 10*3/uL — ABNORMAL HIGH (ref 1.7–7.7)
Neutrophils Relative %: 79 %
PLATELETS: 226 10*3/uL (ref 150–400)
RBC: 4.3 MIL/uL (ref 4.22–5.81)
RDW: 13.4 % (ref 11.5–15.5)
WBC: 12.8 10*3/uL — AB (ref 4.0–10.5)

## 2016-12-20 LAB — I-STAT CG4 LACTIC ACID, ED: Lactic Acid, Venous: 0.69 mmol/L (ref 0.5–1.9)

## 2016-12-20 MED ORDER — DOXYCYCLINE HYCLATE 100 MG PO CAPS: 100.0000 mg | ORAL_CAPSULE | Freq: Two times a day (BID) | ORAL | 0 refills | Status: DC

## 2016-12-20 MED ORDER — DOXYCYCLINE HYCLATE 100 MG PO TABS
100.0000 mg | ORAL_TABLET | Freq: Once | ORAL | Status: AC
  Administered 2016-12-20: 100 mg via ORAL
  Filled 2016-12-20: qty 1

## 2016-12-20 MED ORDER — ACETAMINOPHEN 325 MG PO TABS
650.0000 mg | ORAL_TABLET | Freq: Once | ORAL | Status: AC | PRN
  Administered 2016-12-20: 650 mg via ORAL
  Filled 2016-12-20: qty 2

## 2016-12-20 NOTE — ED Triage Notes (Signed)
Pt c/o shortness of breath that has increased today and is worse when lying flat. Diagnosed with pneumonia today. Started on antibiotics.

## 2016-12-20 NOTE — ED Triage Notes (Signed)
Pt here with c/i flu like symptoms since last week ,  General body aches , chills , pt took tylenol before arrival , pt just dx with HIV this past august

## 2016-12-20 NOTE — ED Provider Notes (Signed)
[facility] Provider Note   CSN: [id number] Arrival date & time: 12/20/16  [id number]     History   Chief Complaint Chief Complaint  Patient presents with  . Influenza    HPI Eric [name] is a 26 y.o. male.  HPI  26 y.o. male with a hx of HIV, presents to the Emergency Department today due to flu like symptoms. This occurred last week. Notes generalized body aches, chills. Notes cough is somewhat productive. No rhinorrhea or congestion. No N/V/D. Notes worst part of symptoms is generalized body aches. Feels in knees, left side of neck as well as left side of chest. Rates pain 5/10 and describes as an aching sensation. Subjective fevers. Took Tylenol PTA. No ABD pain. No sick contacts. Pt with recent diagnosis of HIV. Pt is taking Biktarvy. Notes undetectable viral load. No other symptoms noted    Past Medical History:  Diagnosis Date  . Depression 09/22/2016  . Glaucoma    currently untreated   . HIV (human immunodeficiency virus infection) (HCC) 09/22/2016    Patient Active Problem List   Diagnosis Date Noted  . HIV (human immunodeficiency virus infection) (HCC) 09/22/2016  . Healthcare maintenance 09/22/2016  . Depression 09/22/2016    Past Surgical History:  Procedure Laterality Date  . NO PAST SURGERIES         Home Medications    Prior to Admission medications   Medication Sig Start Date End Date Taking? Authorizing Provider  bictegravir-emtricitabine-tenofovir AF (BIKTARVY) 50-200-25 MG TABS tablet Take 1 tablet by mouth daily. Try to take at the same time each day with or without food. 10/05/16   Blanchard Kelchixon, Stephanie N, NP    Family History Family History  Problem Relation Age of Onset  . Hypertension Father   . Diabetes Father   . Healthy Brother   . Hypertension Maternal Grandmother   . Kidney disease Maternal Grandmother     Social History Social History   Tobacco Use  . Smoking status: Never Smoker  . Smokeless  tobacco: Never Used  Substance Use Topics  . Alcohol use: Yes    Comment: social  . Drug use: No     Allergies   Patient has no known allergies.   Review of Systems Review of Systems ROS reviewed and all are negative for acute change except as noted in the HPI.  Physical Exam Updated Vital Signs BP 134/81 (BP Location: Right Arm)   Pulse (!) 105   Temp 99.8 F (37.7 C) (Oral)   Resp 20   SpO2 99%   Physical Exam  Constitutional: He is oriented to person, place, and time. He appears well-developed and well-nourished. No distress.  HENT:  Head: Normocephalic and atraumatic.  Right Ear: Tympanic membrane, external ear and ear canal normal.  Left Ear: Tympanic membrane, external ear and ear canal normal.  Nose: Nose normal.  Mouth/Throat: Uvula is midline, oropharynx is clear and moist and mucous membranes are normal. No trismus in the jaw. No oropharyngeal exudate, posterior oropharyngeal erythema or tonsillar abscesses.  Eyes: EOM are normal. Pupils are equal, round, and reactive to light.  Neck: Normal range of motion. Neck supple. No tracheal deviation present.  Cardiovascular: Regular rhythm, S1 normal, S2 normal, normal heart sounds, intact distal pulses and normal pulses. Tachycardia present.  Pulmonary/Chest: Effort normal and breath sounds normal. No respiratory distress. He has no decreased breath sounds. He has no wheezes. He has no rhonchi. He has no rales.  Abdominal: Normal  appearance and bowel sounds are normal. There is no tenderness.  Musculoskeletal: Normal range of motion.  Neurological: He is alert and oriented to person, place, and time.  Skin: Skin is warm and dry.  Psychiatric: He has a normal mood and affect. His speech is normal and behavior is normal. Thought content normal.  Nursing note and vitals reviewed.    ED Treatments / Results  Labs (all labs ordered are listed, but only abnormal results are displayed) Labs Reviewed - No data to  display  EKG  EKG Interpretation None       Radiology Dg Chest 2 View  Result Date: 12/20/2016 CLINICAL DATA:  Flu like symptoms for 1 week. General body aches and chills. Recent diagnosis of HIV. EXAM: CHEST  2 VIEW COMPARISON:  None. FINDINGS: Posterior and lower lobe airspace disease is evident on the lateral view. This is not well visualized in the PA view, with may be in the medial left lung. The upper lung fields are clear. Heart size normal. The visualized soft tissues and bony thorax are unremarkable. IMPRESSION: Left lower lobe pneumonia. Electronically Signed   By: Marin Robertshristopher  Mattern M.D.   On: 12/20/2016 10:26    Procedures Procedures (including critical care time)  Medications Ordered in ED Medications  doxycycline (VIBRA-TABS) tablet 100 mg (not administered)     Initial Impression / Assessment and Plan / ED Course  I have reviewed the triage vital signs and the nursing notes.  Pertinent labs & imaging results that were available during my care of the patient were reviewed by me and considered in my medical decision making (see chart for details).  Final Clinical Impressions(s) / ED Diagnoses  {I have reviewed and evaluated the relevant laboratory values. {I have reviewed and evaluated the relevant imaging studies.  {I have reviewed the relevant previous healthcare records.  {I obtained HPI from historian. {Patient discussed with supervising physician.  ED Course:  Assessment: Pt is a 26 y.o. male with a hx of HIV, presents to the Emergency Department today due to flu like symptoms. This occurred last week. Notes generalized body aches, chills. Notes cough is somewhat productive. No rhinorrhea or congestion. No N/V/D. Notes worst part of symptoms is generalized body aches. Feels in knees, left side of neck as well as left side of chest. Rates pain 5/10 and describes as an aching sensation. Subjective fevers. Took Tylenol PTA. No ABD pain. No sick contacts. Pt with  recent diagnosis of HIV. Pt is taking Biktarvy. Notes undetectable viral load. On exam, pt in NAD. Nontoxic/nonseptic appearing. VSS. Temp 99.41F. Lungs CTA. Heart RRR. Abdomen nontender soft. CXR shows LLL PNA. Given doxy. Plan is to DC home with close follow up to PCP. At time of discharge, Patient is in no acute distress. Vital Signs are stable. Patient is able to ambulate. Patient able to tolerate PO.   Disposition/Plan:  DC Home Additional Verbal discharge instructions given and discussed with patient.  Pt Instructed to f/u with PCP in the next week for evaluation and treatment of symptoms. Return precautions given Pt acknowledges and agrees with plan  Supervising Physician Melene PlanFloyd, Dan, DO  Final diagnoses:  Community acquired pneumonia of left lower lobe of lung [facility](HCC)    ED Discharge Orders    None       Audry PiliMohr, Gennavieve Huq, PA-C 12/20/16 1036    Melene PlanFloyd, Dan, DO 12/20/16 1303

## 2016-12-20 NOTE — Discharge Instructions (Signed)
Please read and follow all provided instructions.  Your diagnoses today include:  1. Community acquired pneumonia of left lower lobe of lung (HCC)    Tests performed today include: Chest x-ray -- shows pneumonia Vital signs. See below for your results today.   Medications prescribed:   Take any prescribed medications only as directed.  Home care instructions:  Follow any educational materials contained in this packet.  Take the complete course of antibiotics that you were prescribed.   BE VERY CAREFUL not to take multiple medicines containing Tylenol (also called acetaminophen). Doing so can lead to an overdose which can damage your liver and cause liver failure and possibly death.   Follow-up instructions: Please follow-up with your primary care provider in the next 3 days for further evaluation of your symptoms and to ensure resolution of your infection.   Return instructions:  Please return to the Emergency Department if you experience worsening symptoms.  Return immediately with worsening breathing, worsening shortness of breath, or if you feel it is taking you more effort to breathe.  Please return if you have any other emergent concerns.  Additional Information:  Your vital signs today were: BP 134/81 (BP Location: Right Arm)    Pulse (!) 105    Temp 99.8 F (37.7 C) (Oral)    Resp 20    SpO2 99%  If your blood pressure (BP) was elevated above 135/85 this visit, please have this repeated by your doctor within one month. --------------

## 2016-12-21 ENCOUNTER — Telehealth: Payer: Self-pay | Admitting: Licensed Clinical Social Worker

## 2016-12-21 ENCOUNTER — Emergency Department (HOSPITAL_COMMUNITY): Payer: Self-pay

## 2016-12-21 ENCOUNTER — Inpatient Hospital Stay (HOSPITAL_COMMUNITY)
Admission: EM | Admit: 2016-12-21 | Discharge: 2016-12-28 | DRG: 871 | Disposition: A | Discharge status: Home / Self Care | Payer: Self-pay | Attending: Internal Medicine | Admitting: Internal Medicine

## 2016-12-21 ENCOUNTER — Other Ambulatory Visit: Payer: Self-pay

## 2016-12-21 DIAGNOSIS — B20 Human immunodeficiency virus [HIV] disease: Secondary | ICD-10-CM

## 2016-12-21 DIAGNOSIS — J189 Pneumonia, unspecified organism: Secondary | ICD-10-CM | POA: Diagnosis present

## 2016-12-21 DIAGNOSIS — A400 Sepsis due to streptococcus, group A: Secondary | ICD-10-CM

## 2016-12-21 DIAGNOSIS — R7881 Bacteremia: Secondary | ICD-10-CM

## 2016-12-21 DIAGNOSIS — J90 Pleural effusion, not elsewhere classified: Secondary | ICD-10-CM

## 2016-12-21 DIAGNOSIS — R509 Fever, unspecified: Secondary | ICD-10-CM

## 2016-12-21 DIAGNOSIS — E861 Hypovolemia: Secondary | ICD-10-CM

## 2016-12-21 DIAGNOSIS — I959 Hypotension, unspecified: Secondary | ICD-10-CM

## 2016-12-21 DIAGNOSIS — Z9889 Other specified postprocedural states: Secondary | ICD-10-CM

## 2016-12-21 DIAGNOSIS — I9589 Other hypotension: Secondary | ICD-10-CM

## 2016-12-21 DIAGNOSIS — R0602 Shortness of breath: Secondary | ICD-10-CM

## 2016-12-21 DIAGNOSIS — N179 Acute kidney failure, unspecified: Secondary | ICD-10-CM

## 2016-12-21 LAB — COMPREHENSIVE METABOLIC PANEL
ALT: 35 U/L (ref 17–63)
ANION GAP: 8 (ref 5–15)
AST: 28 U/L (ref 15–41)
Albumin: 2.8 g/dL — ABNORMAL LOW (ref 3.5–5.0)
Alkaline Phosphatase: 103 U/L (ref 38–126)
BUN: 8 mg/dL (ref 6–20)
CHLORIDE: 99 mmol/L — AB (ref 101–111)
CO2: 25 mmol/L (ref 22–32)
Calcium: 8.3 mg/dL — ABNORMAL LOW (ref 8.9–10.3)
Creatinine, Ser: 1.3 mg/dL — ABNORMAL HIGH (ref 0.61–1.24)
GFR calc non Af Amer: >60 mL/min (ref >60)
Glucose, Bld: 115 mg/dL — ABNORMAL HIGH (ref 65–99)
POTASSIUM: 3.6 mmol/L (ref 3.5–5.1)
SODIUM: 132 mmol/L — AB (ref 135–145)
Total Bilirubin: 1 mg/dL (ref 0.3–1.2)
Total Protein: 7.4 g/dL (ref 6.5–8.1)

## 2016-12-21 LAB — INFLUENZA PANEL BY PCR (TYPE A & B)
INFLAPCR: NEGATIVE
INFLBPCR: NEGATIVE

## 2016-12-21 LAB — D-DIMER, QUANTITATIVE: D-Dimer, Quant: 2.23 ug/mL-FEU — ABNORMAL HIGH (ref 0.00–0.50)

## 2016-12-21 MED ORDER — ONDANSETRON HCL 4 MG/2ML IJ SOLN: 4.0000 mg | Freq: Four times a day (QID) | INTRAMUSCULAR | Status: DC | PRN

## 2016-12-21 MED ORDER — DEXTROSE 5 % IV SOLN
2.0000 g | Freq: Once | INTRAVENOUS | Status: AC
  Administered 2016-12-21: 2 g via INTRAVENOUS
  Filled 2016-12-21: qty 2

## 2016-12-21 MED ORDER — DEXTROSE 5 % IV SOLN
1.0000 g | INTRAVENOUS | Status: DC
  Filled 2016-12-21: qty 10

## 2016-12-21 MED ORDER — IOPAMIDOL (ISOVUE-370) INJECTION 76%
INTRAVENOUS | Status: AC
  Administered 2016-12-21: 100 mL
  Filled 2016-12-21: qty 100

## 2016-12-21 MED ORDER — SODIUM CHLORIDE 0.9 % IV BOLUS (SEPSIS)
1000.0000 mL | Freq: Once | INTRAVENOUS | Status: AC
  Administered 2016-12-21: 1000 mL via INTRAVENOUS

## 2016-12-21 MED ORDER — KETOROLAC TROMETHAMINE 30 MG/ML IJ SOLN
30.0000 mg | Freq: Once | INTRAMUSCULAR | Status: AC
  Administered 2016-12-21: 30 mg via INTRAVENOUS
  Filled 2016-12-21: qty 1

## 2016-12-21 MED ORDER — DEXTROSE 5 % IV SOLN
500.0000 mg | Freq: Once | INTRAVENOUS | Status: AC
  Administered 2016-12-21: 500 mg via INTRAVENOUS
  Filled 2016-12-21: qty 500

## 2016-12-21 MED ORDER — INFLUENZA VAC SPLIT QUAD 0.5 ML IM SUSY
0.5000 mL | PREFILLED_SYRINGE | INTRAMUSCULAR | Status: AC
  Administered 2016-12-22: 0.5 mL via INTRAMUSCULAR
  Filled 2016-12-21: qty 0.5

## 2016-12-21 MED ORDER — BICTEGRAVIR-EMTRICITAB-TENOFOV 50-200-25 MG PO TABS
1.0000 | ORAL_TABLET | Freq: Every day | ORAL | Status: DC
  Administered 2016-12-21 – 2016-12-28 (×8): 1 via ORAL
  Filled 2016-12-21 (×8): qty 1

## 2016-12-21 MED ORDER — ONDANSETRON HCL 4 MG PO TABS: 4.0000 mg | ORAL_TABLET | Freq: Four times a day (QID) | ORAL | Status: DC | PRN

## 2016-12-21 MED ORDER — ACETAMINOPHEN 500 MG PO TABS
1000.0000 mg | ORAL_TABLET | Freq: Once | ORAL | Status: AC
  Administered 2016-12-21: 1000 mg via ORAL
  Filled 2016-12-21: qty 2

## 2016-12-21 MED ORDER — TRAMADOL HCL 50 MG PO TABS
50.0000 mg | ORAL_TABLET | Freq: Four times a day (QID) | ORAL | Status: DC | PRN
  Administered 2016-12-21 – 2016-12-23 (×4): 50 mg via ORAL
  Filled 2016-12-21 (×5): qty 1

## 2016-12-21 MED ORDER — LACTATED RINGERS IV BOLUS (SEPSIS)
1000.0000 mL | Freq: Once | INTRAVENOUS | Status: AC
  Administered 2016-12-21: 1000 mL via INTRAVENOUS

## 2016-12-21 MED ORDER — SODIUM CHLORIDE 0.9 % IV SOLN
INTRAVENOUS | Status: DC
  Administered 2016-12-21 – 2016-12-22 (×3): via INTRAVENOUS

## 2016-12-21 MED ORDER — PNEUMOCOCCAL VAC POLYVALENT 25 MCG/0.5ML IJ INJ
0.5000 mL | INJECTION | INTRAMUSCULAR | Status: AC
  Administered 2016-12-22: 0.5 mL via INTRAMUSCULAR
  Filled 2016-12-21: qty 0.5

## 2016-12-21 MED ORDER — DEXTROSE 5 % IV SOLN
500.0000 mg | INTRAVENOUS | Status: DC
  Filled 2016-12-21: qty 500

## 2016-12-21 MED ORDER — ACETAMINOPHEN 500 MG PO TABS
1000.0000 mg | ORAL_TABLET | Freq: Four times a day (QID) | ORAL | Status: DC | PRN
  Administered 2016-12-21 – 2016-12-26 (×8): 1000 mg via ORAL
  Filled 2016-12-21 (×8): qty 2

## 2016-12-21 NOTE — ED Provider Notes (Signed)
Assumed care at 7 AM.  Briefly, the patient is a 26 year old male, just diagnosed with kidney acquired pneumonia yesterday, who presents with ongoing shortness of breath.  Patient also has pleuritic pain.  He is on doxycycline.  Patient was febrile to 103 and mildly tachycardic on arrival.  He was borderline hypotensive, though this is his baseline.  He does have history of HIV but is compliant with his medications.  Chest x-ray today is clear.  He has been given fluids.  Plan a follow-up d-dimer and patient response to treatment, with further treatment as indicated.  D-Dimer positive. Pt improving in room, HR 90s, BP 120s systolic. Rocephin ordered. CT A/P pending. Pt updated and in agreement.  CT scan shows multifocal bronchopneumonia.  The patient states he feels mildly improved, but he remains tachypneic in the high 20s in the room.  With any attempt at ambulation, he becomes significantly dyspneic and short of breath.  Given his HIV status, persistent tachypnea and hypotension, and recurrent ED visit, will admit for management of community acquired pneumonia.   Shaune PollackIsaacs, Seona Clemenson, MD 12/21/16 0930

## 2016-12-21 NOTE — H&P (Signed)
History and Physical    Eric [name] [name] [name] DOB: 11/17/1990 DOA: 12/21/2016  PCP: Eric [name] coming from: home  Chief Complaint: sob  HPI: Eric [name] is a 26 y.o. male with medical history significant of HIV, depression. [facility] with complaints of flulike symptoms and shortness of breath. Symptoms started approximately 6-7 days prior to admission and were associated with shortness of breath, generalized body aches and chills, productive cough. Tylenol with some relief of symptoms. Denies abdominal pain, dysuria, frequency, flank pain, rash, neck stiffness, focal neurological deficit, headache. [name] endorses compliance with his HIV medications. At time of initial evaluation [name] was diagnosed with pneumonia and given a prescription for doxycycline. A few hours later [name] returned to the emergency room feeling more short of breath and worse in general. [name] with worsening of his vital signs at time of presentation.   ED Course: Objective findings outlined below. Given Tylenol, subtraction, azithromycin, Toradol, and 1 L normal saline bolus.  Review of Systems: As per HPI otherwise all other systems reviewed and are negative  Ambulatory Status: No restrictions  Past Medical History:  Diagnosis Date  . Depression 09/22/2016  . Glaucoma    currently untreated   . HIV (human immunodeficiency virus infection) (HCC) 09/22/2016    Past Surgical History:  Procedure Laterality Date  . NO PAST SURGERIES      Social History   Socioeconomic History  . Marital status: Single    Spouse name: Not on file  . Number of children: Not on file  . Years of education: Not on file  . Highest education level: Not on file  Social Needs  . Financial resource strain: Not on file  . Food insecurity - worry: Not on file  . Food insecurity - inability: Not on file  . Transportation needs - medical: Not on file  . Transportation  needs - non-medical: Not on file  Occupational History  . Not on file  Tobacco Use  . Smoking status: Never Smoker  . Smokeless tobacco: Never Used  Substance and Sexual Activity  . Alcohol use: Yes    Comment: social  . Drug use: No  . Sexual activity: No  Other Topics Concern  . Not on file  Social History Narrative  . Not on file    No Known Allergies  Family History  Problem Relation Age of Onset  . Hypertension Father   . Diabetes Father   . Healthy Brother   . Hypertension Maternal Grandmother   . Kidney disease Maternal Grandmother       Prior to Admission medications   Medication Sig Start Date End Date Taking? Authorizing Provider  acetaminophen (TYLENOL) 325 MG tablet Take 650 mg every 6 (six) hours as needed by mouth for mild pain.   Yes [provider]  bictegravir-emtricitabine-tenofovir AF (BIKTARVY) 50-200-25 MG TABS tablet Take 1 tablet by mouth daily. Try to take at the same time each day with or without food. 10/05/16  Yes Blanchard Kelchixon, Stephanie N, NP  doxycycline (VIBRAMYCIN) 100 MG capsule Take 1 capsule (100 mg total) 2 (two) times daily for 7 days by mouth. 12/20/16 12/27/16 Yes Mohr, Joselyn Glassmanyler, PA-C  ibuprofen (ADVIL,MOTRIN) 200 MG tablet Take 200-800 mg every 6 (six) hours as needed by mouth for moderate pain.   Yes [provider]    Physical Exam: Vitals:   12/21/16 1000 12/21/16 1030 12/21/16 1115 12/21/16 1130  BP: (!) 100/54 103/62 106/69 111/72  Pulse: 89 87 91 86  Resp: (!) 28 (!) 27 (!) 27 (!) 25  Temp:      TempSrc:      SpO2: 100% 100% 99% 99%  Weight:      Height:         General:  Appears calm and comfortable Eyes:  PERRL, EOMI, normal lids, iris ENT:  grossly normal hearing, lips & tongue, mmm Neck:  no LAD, masses or thyromegaly Cardiovascular:  RRR, no m/r/g. No LE edema.  Respiratory:  CTA bilaterally, no w/r/r. Normal respiratory effort. Abdomen:  soft, ntnd, NABS Skin:  no rash or induration seen on limited  exam Musculoskeletal:  grossly normal tone BUE/BLE, good ROM, no bony abnormality Psychiatric:  grossly normal mood and affect, speech fluent and appropriate, AOx3 Neurologic:  CN 2-12 grossly intact, moves all extremities in coordinated fashion, sensation intact  Labs on Admission: I have personally reviewed following labs and imaging studies  CBC: Recent Labs  Lab 12/20/16 2316  WBC 12.8*  NEUTROABS 10.1*  HGB 13.6  HCT 39.0  MCV 90.7  PLT 226   Basic Metabolic Panel: Recent Labs  Lab 12/20/16 2316  NA 132*  K 3.6  CL 99*  CO2 25  GLUCOSE 115*  BUN 8  CREATININE 1.30*  CALCIUM 8.3*   GFR: Estimated Creatinine Clearance: 108.9 mL/min (A) (by C-G formula based on SCr of 1.3 mg/dL (H)). Liver Function Tests: Recent Labs  Lab 12/20/16 2316  AST 28  ALT 35  ALKPHOS 103  BILITOT 1.0  PROT 7.4  ALBUMIN 2.8*   No results for input(s): LIPASE, AMYLASE in the last 168 hours. No results for input(s): AMMONIA in the last 168 hours. Coagulation Profile: No results for input(s): INR, PROTIME in the last 168 hours. Cardiac Enzymes: No results for input(s): CKTOTAL, CKMB, CKMBINDEX, TROPONINI in the last 168 hours. BNP (last 3 results) No results for input(s): PROBNP in the last 8760 hours. HbA1C: No results for input(s): HGBA1C in the last 72 hours. CBG: No results for input(s): GLUCAP in the last 168 hours. Lipid Profile: No results for input(s): CHOL, HDL, LDLCALC, TRIG, CHOLHDL, LDLDIRECT in the last 72 hours. Thyroid Function Tests: No results for input(s): TSH, T4TOTAL, FREET4, T3FREE, THYROIDAB in the last 72 hours. Anemia Panel: No results for input(s): VITAMINB12, FOLATE, FERRITIN, TIBC, IRON, RETICCTPCT in the last 72 hours. Urine analysis:    Component Value Date/Time   COLORURINE YELLOW 09/22/2016 1701   APPEARANCEUR CLEAR 09/22/2016 1701   LABSPEC 1.015 09/22/2016 1701   PHURINE 6.0 09/22/2016 1701   GLUCOSEU NEGATIVE 09/22/2016 1701   HGBUR  NEGATIVE 09/22/2016 1701   BILIRUBINUR NEGATIVE 09/22/2016 1701   KETONESUR TRACE (A) 09/22/2016 1701   PROTEINUR NEGATIVE 09/22/2016 1701   NITRITE NEGATIVE 09/22/2016 1701   LEUKOCYTESUR NEGATIVE 09/22/2016 1701    Creatinine Clearance: Estimated Creatinine Clearance: 108.9 mL/min (A) (by C-G formula based on SCr of 1.3 mg/dL (H)).  Sepsis Labs: @LABRCNTIP (procalcitonin:4,lacticidven:4) )No results found for this or any previous visit (from the past 240 hour(s)).   Radiological Exams on Admission: Dg Chest 2 View  Result Date: 12/20/2016 CLINICAL DATA:  Flu like symptoms for 1 week. General body aches and chills. Recent diagnosis of HIV. EXAM: CHEST  2 VIEW COMPARISON:  None. FINDINGS: Posterior and lower lobe airspace disease is evident on the lateral view. This is not well visualized in the PA view, with may be in the medial left lung. The upper lung fields are clear. Heart size  normal. The visualized soft tissues and bony thorax are unremarkable. IMPRESSION: Left lower lobe pneumonia. Electronically Signed   By: [name] M.D.   On: 12/20/2016 10:26   Ct Angio Chest Pe W Or Wo Contrast  Result Date: 12/21/2016 CLINICAL DATA:  26 year old male diagnosed with HIV in August. Increasing shortness of Breath. Recently diagnosed with pneumonia and started on antibiotics. Abnormal D-dimer. EXAM: CT ANGIOGRAPHY CHEST WITH CONTRAST TECHNIQUE: Multidetector CT imaging of the chest was performed using the standard protocol during bolus administration of intravenous contrast. Multiplanar CT image reconstructions and MIPs were obtained to evaluate the vascular anatomy. CONTRAST:  ISOVUE-370 IOPAMIDOL (ISOVUE-370) INJECTION 76% COMPARISON:  Chest radiographs 0600 hours today and earlier. FINDINGS: Cardiovascular: Suboptimal contrast bolus timing in the pulmonary arterial tree. No focal filling defect identified in the pulmonary arteries to suggest acute pulmonary embolism. Borderline  to mild cardiomegaly. No pericardial effusion. Grossly negative visible aorta. Mediastinum/Nodes: Thymic tissue in the superior mediastinum is increased over that expected for age. Similar mildly increased mediastinal and bilateral hilar lymph nodes. Lungs/Pleura: Major airways are patent. Trace left pleural effusion partially tracking into the left major fissure. Patchy and confluent lower lobe pulmonary opacity, greater on the left -with maximal involvement of the left lower lobe medial and posterior basal segments. Superimposed mild peripheral peribronchial nodularity in the anterior right middle lobe. Subtle distal peribronchial ground-glass nodularity in the right upper lobe. Upper Abdomen: Visible liver, pancreas, and spleen appear grossly normal. Heterogeneity of the renal upper poles is incompletely visualized but might reflect multiple renal cysts. Negative visible bowel. Musculoskeletal: Negative. Review of the MIP images confirms the above findings. IMPRESSION: 1.  Negative for acute pulmonary embolus. 2. Bilateral multifocal Bronchopneumonia suspected, worst in the left lower lobe. Associated trace Left Pleural Effusion. 3. Nonspecific increased thymus, mediastinal and hilar lymph node tissue. Differential considerations include reactive lymphadenopathy related to #2 versus HIV related lymphoproliferation. 4. Partially visible renal heterogeneity, perhaps bilateral simple renal cysts. Electronically Signed   By: Odessa Fleming M.D.   On: 12/21/2016 09:19   Dg Chest Port 1 View  Result Date: 12/21/2016 CLINICAL DATA:  Acute onset of left lower chest pain and shortness of breath. EXAM: PORTABLE CHEST 1 VIEW COMPARISON:  Chest radiograph performed 12/20/2016 FINDINGS: The lungs are well-aerated and clear. There is no evidence of focal opacification, pleural effusion or pneumothorax. The cardiomediastinal silhouette is borderline normal in size. No acute osseous abnormalities are seen. IMPRESSION: No acute  cardiopulmonary process seen. Electronically Signed   By: Roanna Raider M.D.   On: 12/21/2016 06:29    EKG: Independently reviewed. Tachycardia, no ACS, sinus  Assessment/Plan Active Problems:   HIV (human immunodeficiency virus infection) (HCC)   Multifocal pneumonia   AKI (acute kidney injury) (HCC)   Hypotension   Multifocal pneumonia: suspect combination of viral and bacterial infection. Pt likely developed viral infection which gave way to secondary bacterial superinfection. Doubt opportunistic infection at play given pts compliance w/ HIV regimen but will evaluate further - Ceftriaxone and Azithro - may need to broaden coverage if not improving - Procalcitonin and respiratory viral panel - O2 prn - BCX - CD4 and HIV quant  HIV: new Dx as ov 09/22/16.  - continue Biktarvy - CD4 and HIV quant as above  AKI: Cr 1.3. basleine 0.9. Likely from dehydration.  - IVF - BMET in am  Hypotension/tachycardia/tachypnea: likely due to a combination of HCAP, fever, severe dehydration, and MSK pain. No sepsis.  - Aggressive IVF -  Tylenol - Treatment of HCAP as above      DVT prophylaxis: Lovenox  Code Status: full  Family Communication: none  Disposition Plan: pendingi mprovement  Consults called: none  Admission status: observation    Ozella Rocksavid J Merrell MD [facility]  If 7PM-7AM, please contact night-coverage [email or web address] Password [email or web address]  12/21/2016, 11:34 AM

## 2016-12-21 NOTE — Telephone Encounter (Signed)
[facility] after reviewing chart and noticing that patient was not feeling well last week and was admitted for pneumonia yesterday.  [facility] left encouraging words and stated that Eric AlbertsStephanie Dixon was also aware that he was hospitalized.  Vergia AlbertsSherry Venezia Sargeant, [facility]

## 2016-12-21 NOTE — Progress Notes (Signed)
Pt received from ED with no noted distress. Pt oriented to room. Safety measures in place. Call bell within reach. Family at bedside. Will continue to monitor.

## 2016-12-21 NOTE — ED Notes (Signed)
OJ given to pt, per Dr. Erma HeritageIsaacs

## 2016-12-21 NOTE — ED Provider Notes (Signed)
[facility] Provider Note   CSN: [id number] Arrival date & time: 12/20/16  2306     History   Chief Complaint Chief Complaint  Patient presents with  . Shortness of Breath    HPI Eric [name] is a 26 y.o. male.  The history is provided by the patient.  Shortness of Breath  This is a new problem. The problem occurs frequently.The current episode started yesterday. The problem has been gradually worsening. Associated symptoms include a fever, cough and chest pain. Pertinent negatives include no hemoptysis and no leg swelling. Associated medical issues comments: HIV+.  Patient with h/o HIV(med compliant) presents with continued myalgias, worsening chest pain and shortness of breath.  He was seen in the ED on 11/12, diagnosed with pneumonia and placed on doxycycline He felt improved, but soon after leaving hospital he began having cough, myalgias and pleuritic CP.    No h/o PE No travel  Past Medical History:  Diagnosis Date  . Depression 09/22/2016  . Glaucoma    currently untreated   . HIV (human immunodeficiency virus infection) (HCC) 09/22/2016    Patient Active Problem List   Diagnosis Date Noted  . HIV (human immunodeficiency virus infection) (HCC) 09/22/2016  . Healthcare maintenance 09/22/2016  . Depression 09/22/2016    Past Surgical History:  Procedure Laterality Date  . NO PAST SURGERIES         Home Medications    Prior to Admission medications   Medication Sig Start Date End Date Taking? Authorizing Provider  bictegravir-emtricitabine-tenofovir AF (BIKTARVY) 50-200-25 MG TABS tablet Take 1 tablet by mouth daily. Try to take at the same time each day with or without food. 10/05/16   Blanchard Kelchixon, Stephanie N, NP  doxycycline (VIBRAMYCIN) 100 MG capsule Take 1 capsule (100 mg total) 2 (two) times daily for 7 days by mouth. 12/20/16 12/27/16  Audry PiliMohr, Tyler, PA-C    Family History Family History  Problem Relation Age of  Onset  . Hypertension Father   . Diabetes Father   . Healthy Brother   . Hypertension Maternal Grandmother   . Kidney disease Maternal Grandmother     Social History Social History   Tobacco Use  . Smoking status: Never Smoker  . Smokeless tobacco: Never Used  Substance Use Topics  . Alcohol use: Yes    Comment: social  . Drug use: No     Allergies   Patient has no known allergies.   Review of Systems Review of Systems  Constitutional: Positive for fever.  Respiratory: Positive for cough and shortness of breath. Negative for hemoptysis.   Cardiovascular: Positive for chest pain. Negative for leg swelling.  All other systems reviewed and are negative.    Physical Exam Updated Vital Signs BP 96/84 (BP Location: Right Arm)   Pulse (!) 120   Temp (!) 103.2 F (39.6 C) (Oral)   Resp (!) 24   Ht 1.854 m (6\' 1" )   Wt 99.8 kg (220 lb)   SpO2 99%   BMI 29.03 kg/m   Physical Exam  CONSTITUTIONAL: Well developed/well nourished, uncomfortable appearing HEAD: Normocephalic/atraumatic EYES: EOMI/PERRL ENMT: Mucous membranes moist NECK: supple no meningeal signs SPINE/BACK:entire spine nontender CV: S1/S2 noted, no murmurs/rubs/gallops noted LUNGS: mild tachypnea noted.  Decreased BS noted left base.   ABDOMEN: soft, nontender, no rebound or guarding, bowel sounds noted throughout abdomen GU:no cva tenderness NEURO: Pt is awake/alert/appropriate, moves all extremitiesx4.  No facial droop.   EXTREMITIES: pulses normal/equal, full ROM, no  calf tenderness, no LE edema SKIN: warm, color normal PSYCH: no abnormalities of mood noted, alert and oriented to situation  ED Treatments / Results  Labs (all labs ordered are listed, but only abnormal results are displayed) Labs Reviewed  COMPREHENSIVE METABOLIC PANEL - Abnormal; Notable for the following components:      Result Value   Sodium 132 (*)    Chloride 99 (*)    Glucose, Bld 115 (*)    Creatinine, Ser 1.30 (*)      Calcium 8.3 (*)    Albumin 2.8 (*)    All other components within normal limits  CBC WITH DIFFERENTIAL/PLATELET - Abnormal; Notable for the following components:   WBC 12.8 (*)    Neutro Abs 10.1 (*)    Monocytes Absolute 1.4 (*)    All other components within normal limits  D-DIMER, QUANTITATIVE (NOT AT [facility])  I-STAT CG4 LACTIC ACID, ED    EKG  EKG Interpretation  Date/Time:  Monday December 20 2016 23:13:13 EST Ventricular Rate:  115 PR Interval:  132 QRS Duration: 84 QT Interval:  308 QTC Calculation: 426 R Axis:   7 Text Interpretation:  Sinus tachycardia Otherwise normal ECG No previous ECGs available Confirmed by Zadie RhineWickline, Kasai Beltran ([id number]) on 12/21/2016 5:37:38 AM       Radiology Dg Chest 2 View  Result Date: 12/20/2016 CLINICAL DATA:  Flu like symptoms for 1 week. General body aches and chills. Recent diagnosis of HIV. EXAM: CHEST  2 VIEW COMPARISON:  None. FINDINGS: Posterior and lower lobe airspace disease is evident on the lateral view. This is not well visualized in the PA view, with may be in the medial left lung. The upper lung fields are clear. Heart size normal. The visualized soft tissues and bony thorax are unremarkable. IMPRESSION: Left lower lobe pneumonia. Electronically Signed   By: Marin Robertshristopher  Mattern M.D.   On: 12/20/2016 10:26    Procedures Procedures   Medications Ordered in ED Medications  sodium chloride 0.9 % bolus 1,000 mL (not administered)  acetaminophen (TYLENOL) tablet 650 mg (650 mg Oral Given 12/20/16 2322)  sodium chloride 0.9 % bolus 1,000 mL (1,000 mLs Intravenous New Bag/Given 12/21/16 0635)  ketorolac (TORADOL) 30 MG/ML injection 30 mg (30 mg Intravenous Given 12/21/16 [id number])     Initial Impression / Assessment and Plan / ED Course  I have reviewed the triage vital signs and the nursing notes.  Pertinent labs & imaging results that were available during my care of the patient were reviewed by me and considered in my medical  decision making (see chart for details).     6:05 AM Pt diagnosed with pneumonia on 11/12, now with worsening pleuritic CP/SOB and fever Will give fluids, meds and repeat CXR 7:14 AM PT WITH SOME IMPROVEMENT IN PAIN CXR AT THIS TIME IS READ AS NEGATIVE THIS COULD REPRESENT PE WILL SEND D-DIMER  AT SIGNOUT TO DR ISAACS, F/U ON D-DIMER IF NEGATIVE AND PT IS IMPROVED/AMBULATORY WITHOUT HYPOXIA HE MAY BE SAFE FOR DISCHARGE   Final Clinical Impressions(s) / ED Diagnoses   Final diagnoses:  Shortness of breath    ED Discharge Orders    None       Zadie RhineWickline, Orin Eberwein, MD 12/21/16 [phone]

## 2016-12-21 NOTE — ED Notes (Signed)
Patient was given a cup of  Ice and apple juice.

## 2016-12-21 NOTE — ED Notes (Signed)
Per lab pt need 3 lavender tops and one light green.

## 2016-12-22 LAB — BLOOD CULTURE ID PANEL (REFLEXED)
Acinetobacter baumannii: NOT DETECTED
CANDIDA ALBICANS: NOT DETECTED
CANDIDA GLABRATA: NOT DETECTED
Candida krusei: NOT DETECTED
Candida parapsilosis: NOT DETECTED
Candida tropicalis: NOT DETECTED
ENTEROBACTER CLOACAE COMPLEX: NOT DETECTED
ENTEROBACTERIACEAE SPECIES: NOT DETECTED
ESCHERICHIA COLI: NOT DETECTED
Enterococcus species: NOT DETECTED
Haemophilus influenzae: NOT DETECTED
KLEBSIELLA OXYTOCA: NOT DETECTED
KLEBSIELLA PNEUMONIAE: NOT DETECTED
Listeria monocytogenes: NOT DETECTED
NEISSERIA MENINGITIDIS: NOT DETECTED
Proteus species: NOT DETECTED
Pseudomonas aeruginosa: NOT DETECTED
STAPHYLOCOCCUS AUREUS BCID: NOT DETECTED
STAPHYLOCOCCUS SPECIES: NOT DETECTED
STREPTOCOCCUS AGALACTIAE: NOT DETECTED
STREPTOCOCCUS PNEUMONIAE: NOT DETECTED
STREPTOCOCCUS SPECIES: DETECTED — AB
Serratia marcescens: NOT DETECTED
Streptococcus pyogenes: DETECTED — AB

## 2016-12-22 LAB — CBC
HEMATOCRIT: 33.9 % — AB (ref 39.0–52.0)
Hemoglobin: 11.9 g/dL — ABNORMAL LOW (ref 13.0–17.0)
MCH: 31.6 pg (ref 26.0–34.0)
MCHC: 35.1 g/dL (ref 30.0–36.0)
MCV: 90.2 fL (ref 78.0–100.0)
Platelets: 238 10*3/uL (ref 150–400)
RBC: 3.76 MIL/uL — AB (ref 4.22–5.81)
RDW: 13.6 % (ref 11.5–15.5)
WBC: 9.5 10*3/uL (ref 4.0–10.5)

## 2016-12-22 LAB — RESPIRATORY PANEL BY PCR
ADENOVIRUS-RVPPCR: NOT DETECTED
BORDETELLA PERTUSSIS-RVPCR: NOT DETECTED
CHLAMYDOPHILA PNEUMONIAE-RVPPCR: NOT DETECTED
CORONAVIRUS 229E-RVPPCR: NOT DETECTED
CORONAVIRUS HKU1-RVPPCR: NOT DETECTED
CORONAVIRUS NL63-RVPPCR: NOT DETECTED
Coronavirus OC43: NOT DETECTED
Influenza A: NOT DETECTED
Influenza B: NOT DETECTED
METAPNEUMOVIRUS-RVPPCR: NOT DETECTED
Mycoplasma pneumoniae: NOT DETECTED
PARAINFLUENZA VIRUS 2-RVPPCR: NOT DETECTED
Parainfluenza Virus 1: NOT DETECTED
Parainfluenza Virus 3: NOT DETECTED
Parainfluenza Virus 4: NOT DETECTED
Respiratory Syncytial Virus: NOT DETECTED
Rhinovirus / Enterovirus: NOT DETECTED

## 2016-12-22 LAB — BASIC METABOLIC PANEL
ANION GAP: 7 (ref 5–15)
BUN: 5 mg/dL — ABNORMAL LOW (ref 6–20)
CO2: 23 mmol/L (ref 22–32)
Calcium: 8 mg/dL — ABNORMAL LOW (ref 8.9–10.3)
Chloride: 105 mmol/L (ref 101–111)
Creatinine, Ser: 1.03 mg/dL (ref 0.61–1.24)
GFR calc Af Amer: >60 mL/min (ref >60)
GFR calc non Af Amer: >60 mL/min (ref >60)
GLUCOSE: 101 mg/dL — AB (ref 65–99)
POTASSIUM: 3.7 mmol/L (ref 3.5–5.1)
Sodium: 135 mmol/L (ref 135–145)

## 2016-12-22 LAB — PROCALCITONIN: Procalcitonin: 2.67 ng/mL

## 2016-12-22 MED ORDER — WHITE PETROLATUM EX OINT
TOPICAL_OINTMENT | CUTANEOUS | Status: AC
  Administered 2016-12-22: 1
  Filled 2016-12-22: qty 28.35

## 2016-12-22 MED ORDER — ENOXAPARIN SODIUM 40 MG/0.4ML ~~LOC~~ SOLN
40.0000 mg | SUBCUTANEOUS | Status: DC
  Administered 2016-12-22 – 2016-12-25 (×4): 40 mg via SUBCUTANEOUS
  Filled 2016-12-22 (×4): qty 0.4

## 2016-12-22 MED ORDER — PENICILLIN G POTASSIUM 5000000 UNITS IJ SOLR
4.0000 10*6.[IU] | INTRAMUSCULAR | Status: DC
  Administered 2016-12-22 – 2016-12-28 (×37): 4 10*6.[IU] via INTRAVENOUS
  Filled 2016-12-22 (×40): qty 4

## 2016-12-22 NOTE — Progress Notes (Signed)
PHARMACY - PHYSICIAN COMMUNICATION CRITICAL VALUE ALERT - BLOOD CULTURE IDENTIFICATION (BCID)  Results for orders placed or performed during the hospital encounter of 12/21/16  Blood Culture ID Panel (Reflexed) (Collected: 12/21/2016  7:50 AM)  Result Value Ref Range   Enterococcus species NOT DETECTED NOT DETECTED   Listeria monocytogenes NOT DETECTED NOT DETECTED   Staphylococcus species NOT DETECTED NOT DETECTED   Staphylococcus aureus NOT DETECTED NOT DETECTED   Streptococcus species DETECTED (A) NOT DETECTED   Streptococcus agalactiae NOT DETECTED NOT DETECTED   Streptococcus pneumoniae NOT DETECTED NOT DETECTED   Streptococcus pyogenes DETECTED (A) NOT DETECTED   Acinetobacter baumannii NOT DETECTED NOT DETECTED   Enterobacteriaceae species NOT DETECTED NOT DETECTED   Enterobacter cloacae complex NOT DETECTED NOT DETECTED   Escherichia coli NOT DETECTED NOT DETECTED   Klebsiella oxytoca NOT DETECTED NOT DETECTED   Klebsiella pneumoniae NOT DETECTED NOT DETECTED   Proteus species NOT DETECTED NOT DETECTED   Serratia marcescens NOT DETECTED NOT DETECTED   Haemophilus influenzae NOT DETECTED NOT DETECTED   Neisseria meningitidis NOT DETECTED NOT DETECTED   Pseudomonas aeruginosa NOT DETECTED NOT DETECTED   Candida albicans NOT DETECTED NOT DETECTED   Candida glabrata NOT DETECTED NOT DETECTED   Candida krusei NOT DETECTED NOT DETECTED   Candida parapsilosis NOT DETECTED NOT DETECTED   Candida tropicalis NOT DETECTED NOT DETECTED    Name of physician (or Provider) Contacted: Gherghe   Changes to prescribed antibiotics required: Gram stain with GPCs in chains and BCID as above. Tmax 100.6, WBC/LA normal. Start Penicillin G per MD and D/C CTX/azithro for now. Continue to follow clinical picture and culture data.   Einar CrowKatherine Damein Gaunce, PharmD Clinical Pharmacist 12/22/16 7:01 AM

## 2016-12-22 NOTE — Care Management Note (Signed)
Case Management Note  Patient Details  Name: Eric GoadLee Enberg MRN: [id number] Date of Birth: 02/19/1990  Subjective/Objective:    Pt admitted with pneumonia. Pt seen by [facility] for 042. Pt from home with a roommate. Pt has no insurance and no PCP.              Action/Plan: Plan is for patient to return home when medically ready. CM following for d/c needs.   Expected Discharge Date:                  Expected Discharge Plan:  Home/Self Care  In-House Referral:     Discharge planning Services     Post Acute Care Choice:    Choice offered to:     DME Arranged:    DME Agency:     HH Arranged:    HH Agency:     Status of Service:  In process, will continue to follow  If discussed at Long Length of Stay Meetings, dates discussed:    Additional Comments:  Kermit BaloKelli F Melesio Madara, RN 12/22/2016, 10:57 AM

## 2016-12-22 NOTE — Progress Notes (Signed)
PROGRESS NOTE  Eric [name] Mcauliff WUJ:[id number] DOB: 02/07/1991 DOA: 12/21/2016 PCP: Patient, No Pcp Per   LOS: 0 days   Brief Narrative / Interim history: Eric [name] Eric [name] is a 26 y.o. male with medical history significant of HIV, depression. [facility]/2016 with complaints of flulike symptoms and shortness of breath. Symptoms started approximately 6-7 days prior to admission and were associated with shortness of breath, generalized body aches and chills, productive cough. Tylenol with some relief of symptoms. Denies abdominal pain, dysuria, frequency, flank pain, rash, neck stiffness, focal neurological deficit, headache  Assessment & Plan: Active Problems:   HIV (human immunodeficiency virus infection) (HCC)   Multifocal pneumonia   AKI (acute kidney injury) (HCC)   Hypotension   Multifocal pneumonia / Streptococcus pyogenes bacteremia -Narrowed to penicillin, discussed with pharmacy, CD4 and HIV pending -Respiratory viral panel pending  HIV -new Dx as ov 09/22/16 -continue Biktarvy -CD4 and HIV quant as above  AKI -Cr 1.3. basleine 0.9. Likely from dehydration.  -Creatinine is normalized this morning     DVT prophylaxis: Lovenox Code Status: Full code Family Communication: no family in the room Disposition Plan: home when ready   Consultants:   None   Procedures:   None  Antimicrobials:  Ceftriaxone / Azithromycin 11/13 >> 11/14  Penocillin 11/14   Subjective: -Feels better, had some chills this morning, no chest pain, no shortness of breath.  No abdominal pain, nausea or vomiting  Objective: Vitals:   12/21/16 1817 12/21/16 2056 12/22/16 0100 12/22/16 0500  BP: 119/65 122/61 124/68 121/66  Pulse: (!) 106 99 (!) 110 (!) 109  Resp: 18 18 18 18   Temp: 98.9 F (37.2 C) 98.5 F (36.9 C) 98.9 F (37.2 C) (!) 100.6 F (38.1 C)  TempSrc: Oral Oral Oral Axillary  SpO2: 100% 100% 97% 98%  Weight:      Height:         Intake/Output Summary (Last 24 hours) at 12/22/2016 0932 Last data filed at 12/22/2016 0527 Gross per 24 hour  Intake 3791.67 ml  Output 3800 ml  Net -8.33 ml   Filed Weights   12/20/16 2317  Weight: 99.8 kg (220 lb)    Examination:  Constitutional: NAD Eyes: lids and conjunctivae normal ENMT: Mucous membranes are moist.  Neck: normal, supple Respiratory: clear to auscultation bilaterally, no wheezing, no crackles.  Cardiovascular: Regular rate and rhythm, no murmurs / rubs / gallops. No LE edema.  Abdomen: no tenderness. Bowel sounds positive.  Skin: no rashes, lesions, ulcers. No induration Neurologic: CN 2-12 grossly intact. Strength 5/5 in all 4.  Data Reviewed: I have independently reviewed following labs and imaging studies  CBC: Recent Labs  Lab 12/20/16 2316 12/22/16 0319  WBC 12.8* 9.5  NEUTROABS 10.1*  --   HGB 13.6 11.9*  HCT 39.0 33.9*  MCV 90.7 90.2  PLT 226 238   Basic Metabolic Panel: Recent Labs  Lab 12/20/16 2316 12/22/16 0319  NA 132* 135  K 3.6 3.7  CL 99* 105  CO2 25 23  GLUCOSE 115* 101*  BUN 8 5*  CREATININE 1.30* 1.03  CALCIUM 8.3* 8.0*   GFR: Estimated Creatinine Clearance: 137.5 mL/min (by C-G formula based on SCr of 1.03 mg/dL). Liver Function Tests: Recent Labs  Lab 12/20/16 2316  AST 28  ALT 35  ALKPHOS 103  BILITOT 1.0  PROT 7.4  ALBUMIN 2.8*   No results for input(s): LIPASE, AMYLASE in the last 168 hours. No results for input(s):  AMMONIA in the last 168 hours. Coagulation Profile: No results for input(s): INR, PROTIME in the last 168 hours. Cardiac Enzymes: No results for input(s): CKTOTAL, CKMB, CKMBINDEX, TROPONINI in the last 168 hours. BNP (last 3 results) No results for input(s): PROBNP in the last 8760 hours. HbA1C: No results for input(s): HGBA1C in the last 72 hours. CBG: No results for input(s): GLUCAP in the last 168 hours. Lipid Profile: No results for input(s): CHOL, HDL, LDLCALC, TRIG,  CHOLHDL, LDLDIRECT in the last 72 hours. Thyroid Function Tests: No results for input(s): TSH, T4TOTAL, FREET4, T3FREE, THYROIDAB in the last 72 hours. Anemia Panel: No results for input(s): VITAMINB12, FOLATE, FERRITIN, TIBC, IRON, RETICCTPCT in the last 72 hours. Urine analysis:    Component Value Date/Time   COLORURINE YELLOW 09/22/2016 1701   APPEARANCEUR CLEAR 09/22/2016 1701   LABSPEC 1.015 09/22/2016 1701   PHURINE 6.0 09/22/2016 1701   GLUCOSEU NEGATIVE 09/22/2016 1701   HGBUR NEGATIVE 09/22/2016 1701   BILIRUBINUR NEGATIVE 09/22/2016 1701   KETONESUR TRACE (A) 09/22/2016 1701   PROTEINUR NEGATIVE 09/22/2016 1701   NITRITE NEGATIVE 09/22/2016 1701   LEUKOCYTESUR NEGATIVE 09/22/2016 1701   Sepsis Labs: Invalid input(s): PROCALCITONIN, LACTICIDVEN  Recent Results (from the past 240 hour(s))  Blood culture (routine x 2)     Status: None (Preliminary result)   Collection Time: 12/21/16  7:50 AM  Result Value Ref Range Status   Specimen Description BLOOD RIGHT ANTECUBITAL  Final   Special Requests   Final    BOTTLES DRAWN AEROBIC AND ANAEROBIC Blood Culture adequate volume   Culture  Setup Time   Final    Organism ID to follow GRAM POSITIVE COCCI IN CHAINS AEROBIC BOTTLE ONLY CRITICAL RESULT CALLED TO, READ BACK BY AND VERIFIED WITH: Alex Gardener PHARMD, AT 1610 12/22/16 BY D. VANHOOK    Culture GRAM POSITIVE COCCI  Final   Report Status PENDING  Incomplete  Blood Culture ID Panel (Reflexed)     Status: Abnormal   Collection Time: 12/21/16  7:50 AM  Result Value Ref Range Status   Enterococcus species NOT DETECTED NOT DETECTED Final   Listeria monocytogenes NOT DETECTED NOT DETECTED Final   Staphylococcus species NOT DETECTED NOT DETECTED Final   Staphylococcus aureus NOT DETECTED NOT DETECTED Final   Streptococcus species DETECTED (A) NOT DETECTED Final    Comment: CRITICAL RESULT CALLED TO, READ BACK BY AND VERIFIED WITH: Alex Gardener PHARMD, AT [id number] 12/22/16 BY D.  VANHOOK    Streptococcus agalactiae NOT DETECTED NOT DETECTED Final   Streptococcus pneumoniae NOT DETECTED NOT DETECTED Final   Streptococcus pyogenes DETECTED (A) NOT DETECTED Final    Comment: CRITICAL RESULT CALLED TO, READ BACK BY AND VERIFIED WITH: Alex Gardener PHARMD, AT 5409 12/22/16 BY D. VANHOOK    Acinetobacter baumannii NOT DETECTED NOT DETECTED Final   Enterobacteriaceae species NOT DETECTED NOT DETECTED Final   Enterobacter cloacae complex NOT DETECTED NOT DETECTED Final   Escherichia coli NOT DETECTED NOT DETECTED Final   Klebsiella oxytoca NOT DETECTED NOT DETECTED Final   Klebsiella pneumoniae NOT DETECTED NOT DETECTED Final   Proteus species NOT DETECTED NOT DETECTED Final   Serratia marcescens NOT DETECTED NOT DETECTED Final   Haemophilus influenzae NOT DETECTED NOT DETECTED Final   Neisseria meningitidis NOT DETECTED NOT DETECTED Final   Pseudomonas aeruginosa NOT DETECTED NOT DETECTED Final   Candida albicans NOT DETECTED NOT DETECTED Final   Candida glabrata NOT DETECTED NOT DETECTED Final   Candida krusei NOT DETECTED NOT  DETECTED Final   Candida parapsilosis NOT DETECTED NOT DETECTED Final   Candida tropicalis NOT DETECTED NOT DETECTED Final      Radiology Studies: Dg Chest 2 View  Result Date: 12/20/2016 CLINICAL DATA:  Flu like symptoms for 1 week. General body aches and chills. Recent diagnosis of HIV. EXAM: CHEST  2 VIEW COMPARISON:  None. FINDINGS: Posterior and lower lobe airspace disease is evident on the lateral view. This is not well visualized in the PA view, with may be in the medial left lung. The upper lung fields are clear. Heart size normal. The visualized soft tissues and bony thorax are unremarkable. IMPRESSION: Left lower lobe pneumonia. Electronically Signed   By: [name] M.D.   On: 12/20/2016 10:26   Ct Angio Chest Pe W Or Wo Contrast  Result Date: 12/21/2016 CLINICAL DATA:  26 year old male diagnosed with HIV in August.  Increasing shortness of Breath. Recently diagnosed with pneumonia and started on antibiotics. Abnormal D-dimer. EXAM: CT ANGIOGRAPHY CHEST WITH CONTRAST TECHNIQUE: Multidetector CT imaging of the chest was performed using the standard protocol during bolus administration of intravenous contrast. Multiplanar CT image reconstructions and MIPs were obtained to evaluate the vascular anatomy. CONTRAST:  ISOVUE-370 IOPAMIDOL (ISOVUE-370) INJECTION 76% COMPARISON:  Chest radiographs 0600 hours today and earlier. FINDINGS: Cardiovascular: Suboptimal contrast bolus timing in the pulmonary arterial tree. No focal filling defect identified in the pulmonary arteries to suggest acute pulmonary embolism. Borderline to mild cardiomegaly. No pericardial effusion. Grossly negative visible aorta. Mediastinum/Nodes: Thymic tissue in the superior mediastinum is increased over that expected for age. Similar mildly increased mediastinal and bilateral hilar lymph nodes. Lungs/Pleura: Major airways are patent. Trace left pleural effusion partially tracking into the left major fissure. Patchy and confluent lower lobe pulmonary opacity, greater on the left -with maximal involvement of the left lower lobe medial and posterior basal segments. Superimposed mild peripheral peribronchial nodularity in the anterior right middle lobe. Subtle distal peribronchial ground-glass nodularity in the right upper lobe. Upper Abdomen: Visible liver, pancreas, and spleen appear grossly normal. Heterogeneity of the renal upper poles is incompletely visualized but might reflect multiple renal cysts. Negative visible bowel. Musculoskeletal: Negative. Review of the MIP images confirms the above findings. IMPRESSION: 1.  Negative for acute pulmonary embolus. 2. Bilateral multifocal Bronchopneumonia suspected, worst in the left lower lobe. Associated trace Left Pleural Effusion. 3. Nonspecific increased thymus, mediastinal and hilar lymph node tissue.  Differential considerations include reactive lymphadenopathy related to #2 versus HIV related lymphoproliferation. 4. Partially visible renal heterogeneity, perhaps bilateral simple renal cysts. Electronically Signed   By: Odessa Fleming M.D.   On: 12/21/2016 09:19   Dg Chest Port 1 View  Result Date: 12/21/2016 CLINICAL DATA:  Acute onset of left lower chest pain and shortness of breath. EXAM: PORTABLE CHEST 1 VIEW COMPARISON:  Chest radiograph performed 12/20/2016 FINDINGS: The lungs are well-aerated and clear. There is no evidence of focal opacification, pleural effusion or pneumothorax. The cardiomediastinal silhouette is borderline normal in size. No acute osseous abnormalities are seen. IMPRESSION: No acute cardiopulmonary process seen. Electronically Signed   By: Roanna Raider M.D.   On: 12/21/2016 06:29     Scheduled Meds: . bictegravir-emtricitabine-tenofovir AF  1 tablet Oral Daily  . Influenza vac split quadrivalent PF  0.5 mL Intramuscular Tomorrow-1000  . pneumococcal 23 valent vaccine  0.5 mL Intramuscular Tomorrow-1000   Continuous Infusions: . sodium chloride 100 mL/hr at 12/22/16 0332  . pencillin G potassium IV 4 Million Units (12/22/16 1610)  Pamella Pertostin Babs Dabbs, MD, PhD [facility] Pager [phone]  If 7PM-7AM, please contact night-coverage [email or web address] Password [email or web address] 12/22/2016, 9:32 AM

## 2016-12-23 LAB — BASIC METABOLIC PANEL
ANION GAP: 7 (ref 5–15)
BUN: 5 mg/dL — AB (ref 6–20)
CO2: 24 mmol/L (ref 22–32)
Calcium: 7.7 mg/dL — ABNORMAL LOW (ref 8.9–10.3)
Chloride: 102 mmol/L (ref 101–111)
Creatinine, Ser: 1.08 mg/dL (ref 0.61–1.24)
GFR calc Af Amer: >60 mL/min (ref >60)
GFR calc non Af Amer: >60 mL/min (ref >60)
GLUCOSE: 107 mg/dL — AB (ref 65–99)
POTASSIUM: 4 mmol/L (ref 3.5–5.1)
Sodium: 133 mmol/L — ABNORMAL LOW (ref 135–145)

## 2016-12-23 LAB — CBC
HEMATOCRIT: 34.2 % — AB (ref 39.0–52.0)
HEMOGLOBIN: 11.8 g/dL — AB (ref 13.0–17.0)
MCH: 31.4 pg (ref 26.0–34.0)
MCHC: 34.5 g/dL (ref 30.0–36.0)
MCV: 91 fL (ref 78.0–100.0)
Platelets: 315 10*3/uL (ref 150–400)
RBC: 3.76 MIL/uL — ABNORMAL LOW (ref 4.22–5.81)
RDW: 14 % (ref 11.5–15.5)
WBC: 7.8 10*3/uL (ref 4.0–10.5)

## 2016-12-23 LAB — HIV-1 RNA QUANT-NO REFLEX-BLD
HIV 1 RNA Quant: 40 copies/mL
LOG10 HIV-1 RNA: 1.602 log10copy/mL

## 2016-12-23 NOTE — Progress Notes (Signed)
PROGRESS NOTE  Eric [name] WUJ:[id number]:[id number] DOB: 09/10/1990 DOA: 12/21/2016 PCP: Eric [name], Eric [name]   LOS: 1 day   Brief Narrative / Interim history: Eric GoadLee [name] is a 26 y.o. male with medical history significant of HIV, depression. Pt [facility] on 12/20/2016 with complaints of flulike symptoms and shortness of breath. Symptoms started approximately 6-7 days prior to admission and were associated with shortness of breath, generalized body aches and chills, productive cough. Tylenol with some relief of symptoms. Denies abdominal pain, dysuria, frequency, flank pain, rash, neck stiffness, focal neurological deficit, headache  Assessment & Plan: Active Problems:   HIV (human immunodeficiency virus infection) (HCC)   Multifocal pneumonia   AKI (acute kidney injury) (HCC)   Hypotension   Multifocal pneumonia / Streptococcus pyogenes bacteremia -Narrowed to penicillin, discussed with pharmacy, CD4 and HIV viral load are still pending this morning -Respiratory viral panel negative -Still febrile overnight, continue IV antibiotics for now  HIV -new Dx as ov 09/22/16 -continue Biktarvy -CD4 and HIV quant still pending today  AKI -Cr 1.3. basleine 0.9. Likely from dehydration.  -Creatinine now normalized   DVT prophylaxis: Lovenox Code Status: Full code Family Communication: Eric family in the room Disposition Plan: home when ready   Consultants:   None   Procedures:   None  Antimicrobials:  Ceftriaxone / Azithromycin 11/13 >> 11/14  Penicillin 11/14   Subjective: -chills again when febrile, overall feels better however still short of breath when he moves around  Objective: Vitals:   12/22/16 2053 12/23/16 0232 12/23/16 0540 12/23/16 1000  BP: 129/68 108/66 118/64 126/79  Pulse: 99 97 100 98  Resp: 18 18 18 18   Temp: 99.4 F (37.4 C) (!) 100.9 F (38.3 C) 99.6 F (37.6 C) 99.2 F (37.3 C)  TempSrc: Oral Oral Oral Oral  SpO2: 98% 98% 100% 99%   Weight:      Height:        Intake/Output Summary (Last 24 hours) at 12/23/2016 1149 Last data filed at 12/23/2016 0900 Gross [name] 24 hour  Intake 2746.67 ml  Output 1150 ml  Net 1596.67 ml   Filed Weights   12/20/16 2317  Weight: 99.8 kg (220 lb)    Examination:  Constitutional: NAD Eyes: Eric scleral icterus ENMT: Moist mucous membranes Respiratory: Clear to auscultation, Eric wheezing Cardiovascular: Regular rate and rhythm, Eric murmurs.  Eric edema. Abdomen: Positive bowel sounds, nontender to palpation Neurologic: Nonfocal, ambulatory  Data Reviewed: I have independently reviewed following labs and imaging studies  CBC: Recent Labs  Lab 12/20/16 2316 12/22/16 0319 12/23/16 0319  WBC 12.8* 9.5 7.8  NEUTROABS 10.1*  --   --   HGB 13.6 11.9* 11.8*  HCT 39.0 33.9* 34.2*  MCV 90.7 90.2 91.0  PLT 226 238 315   Basic Metabolic Panel: Recent Labs  Lab 12/20/16 2316 12/22/16 0319 12/23/16 0319  NA 132* 135 133*  K 3.6 3.7 4.0  CL 99* 105 102  CO2 25 23 24   GLUCOSE 115* 101* 107*  BUN 8 5* 5*  CREATININE 1.30* 1.03 1.08  CALCIUM 8.3* 8.0* 7.7*   GFR: Estimated Creatinine Clearance: 131.1 mL/min (by C-G formula based on SCr of 1.08 mg/dL). Liver Function Tests: Recent Labs  Lab 12/20/16 2316  AST 28  ALT 35  ALKPHOS 103  BILITOT 1.0  PROT 7.4  ALBUMIN 2.8*   Eric results for input(s): LIPASE, AMYLASE in the last 168 hours. Eric results for input(s): AMMONIA in the last 168 hours.  Coagulation Profile: Eric results for input(s): INR, PROTIME in the last 168 hours. Cardiac Enzymes: Eric results for input(s): CKTOTAL, CKMB, CKMBINDEX, TROPONINI in the last 168 hours. BNP (last 3 results) Eric results for input(s): PROBNP in the last 8760 hours. HbA1C: Eric results for input(s): HGBA1C in the last 72 hours. CBG: Eric results for input(s): GLUCAP in the last 168 hours. Lipid Profile: Eric results for input(s): CHOL, HDL, LDLCALC, TRIG, CHOLHDL, LDLDIRECT in the last 72  hours. Thyroid Function Tests: Eric results for input(s): TSH, T4TOTAL, FREET4, T3FREE, THYROIDAB in the last 72 hours. Anemia Panel: Eric results for input(s): VITAMINB12, FOLATE, FERRITIN, TIBC, IRON, RETICCTPCT in the last 72 hours. Urine analysis:    Component Value Date/Time   COLORURINE YELLOW 09/22/2016 1701   APPEARANCEUR CLEAR 09/22/2016 1701   LABSPEC 1.015 09/22/2016 1701   PHURINE 6.0 09/22/2016 1701   GLUCOSEU NEGATIVE 09/22/2016 1701   HGBUR NEGATIVE 09/22/2016 1701   BILIRUBINUR NEGATIVE 09/22/2016 1701   KETONESUR TRACE (A) 09/22/2016 1701   PROTEINUR NEGATIVE 09/22/2016 1701   NITRITE NEGATIVE 09/22/2016 1701   LEUKOCYTESUR NEGATIVE 09/22/2016 1701   Sepsis Labs: Invalid input(s): PROCALCITONIN, LACTICIDVEN  Recent Results (from the past 240 hour(s))  Blood culture (routine x 2)     Status: Abnormal (Preliminary result)   Collection Time: 12/21/16  7:50 AM  Result Value Ref Range Status   Specimen Description BLOOD RIGHT ANTECUBITAL  Final   Special Requests   Final    BOTTLES DRAWN AEROBIC AND ANAEROBIC Blood Culture adequate volume   Culture  Setup Time   Final    Organism ID to follow GRAM POSITIVE COCCI IN CHAINS AEROBIC BOTTLE ONLY CRITICAL RESULT CALLED TO, READ BACK BY AND VERIFIED WITH: Alex GardenerK. WEIGLE PHARMD, AT [phone] 12/22/16 BY D. VANHOOK    Culture (A)  Final    GROUP A STREP (S.PYOGENES) ISOLATED SUSCEPTIBILITIES TO FOLLOW    Report Status PENDING  Incomplete  Blood Culture ID Panel (Reflexed)     Status: Abnormal   Collection Time: 12/21/16  7:50 AM  Result Value Ref Range Status   Enterococcus species NOT DETECTED NOT DETECTED Final   Listeria monocytogenes NOT DETECTED NOT DETECTED Final   Staphylococcus species NOT DETECTED NOT DETECTED Final   Staphylococcus aureus NOT DETECTED NOT DETECTED Final   Streptococcus species DETECTED (A) NOT DETECTED Final    Comment: CRITICAL RESULT CALLED TO, READ BACK BY AND VERIFIED WITH: Alex GardenerK. WEIGLE PHARMD, AT  [phone] 12/22/16 BY D. VANHOOK    Streptococcus agalactiae NOT DETECTED NOT DETECTED Final   Streptococcus pneumoniae NOT DETECTED NOT DETECTED Final   Streptococcus pyogenes DETECTED (A) NOT DETECTED Final    Comment: CRITICAL RESULT CALLED TO, READ BACK BY AND VERIFIED WITH: Alex GardenerK. WEIGLE PHARMD, AT [phone] 12/22/16 BY D. VANHOOK    Acinetobacter baumannii NOT DETECTED NOT DETECTED Final   Enterobacteriaceae species NOT DETECTED NOT DETECTED Final   Enterobacter cloacae complex NOT DETECTED NOT DETECTED Final   Escherichia coli NOT DETECTED NOT DETECTED Final   Klebsiella oxytoca NOT DETECTED NOT DETECTED Final   Klebsiella pneumoniae NOT DETECTED NOT DETECTED Final   Proteus species NOT DETECTED NOT DETECTED Final   Serratia marcescens NOT DETECTED NOT DETECTED Final   Haemophilus influenzae NOT DETECTED NOT DETECTED Final   Neisseria meningitidis NOT DETECTED NOT DETECTED Final   Pseudomonas aeruginosa NOT DETECTED NOT DETECTED Final   Candida albicans NOT DETECTED NOT DETECTED Final   Candida glabrata NOT DETECTED NOT DETECTED Final   Candida  krusei NOT DETECTED NOT DETECTED Final   Candida parapsilosis NOT DETECTED NOT DETECTED Final   Candida tropicalis NOT DETECTED NOT DETECTED Final  Blood culture (routine x 2)     Status: None (Preliminary result)   Collection Time: 12/21/16  7:59 AM  Result Value Ref Range Status   Specimen Description BLOOD RIGHT WRIST  Final   Special Requests IN PEDIATRIC BOTTLE Blood Culture adequate volume  Final   Culture Eric GROWTH 1 DAY  Final   Report Status PENDING  Incomplete  Respiratory Panel by PCR     Status: None   Collection Time: 12/21/16  2:47 PM  Result Value Ref Range Status   Adenovirus NOT DETECTED NOT DETECTED Final   Coronavirus 229E NOT DETECTED NOT DETECTED Final   Coronavirus HKU1 NOT DETECTED NOT DETECTED Final   Coronavirus NL63 NOT DETECTED NOT DETECTED Final   Coronavirus OC43 NOT DETECTED NOT DETECTED Final   Metapneumovirus NOT  DETECTED NOT DETECTED Final   Rhinovirus / Enterovirus NOT DETECTED NOT DETECTED Final   Influenza A NOT DETECTED NOT DETECTED Final   Influenza B NOT DETECTED NOT DETECTED Final   Parainfluenza Virus 1 NOT DETECTED NOT DETECTED Final   Parainfluenza Virus 2 NOT DETECTED NOT DETECTED Final   Parainfluenza Virus 3 NOT DETECTED NOT DETECTED Final   Parainfluenza Virus 4 NOT DETECTED NOT DETECTED Final   Respiratory Syncytial Virus NOT DETECTED NOT DETECTED Final   Bordetella pertussis NOT DETECTED NOT DETECTED Final   Chlamydophila pneumoniae NOT DETECTED NOT DETECTED Final   Mycoplasma pneumoniae NOT DETECTED NOT DETECTED Final      Radiology Studies: Eric results found.   Scheduled Meds: . bictegravir-emtricitabine-tenofovir AF  1 tablet Oral Daily  . enoxaparin (LOVENOX) injection  40 mg Subcutaneous Q24H   Continuous Infusions: . pencillin G potassium IV Stopped (12/23/16 0910)    Pamella Pert, MD, PhD [facility] Pager [phone]  If 7PM-7AM, please contact night-coverage [email or web address] Password [email or web address] 12/23/2016, 11:49 AM

## 2016-12-24 ENCOUNTER — Inpatient Hospital Stay (HOSPITAL_COMMUNITY): Payer: Self-pay

## 2016-12-24 DIAGNOSIS — M25562 Pain in left knee: Secondary | ICD-10-CM

## 2016-12-24 DIAGNOSIS — J189 Pneumonia, unspecified organism: Secondary | ICD-10-CM

## 2016-12-24 DIAGNOSIS — R5081 Fever presenting with conditions classified elsewhere: Secondary | ICD-10-CM

## 2016-12-24 DIAGNOSIS — Z21 Asymptomatic human immunodeficiency virus [HIV] infection status: Secondary | ICD-10-CM

## 2016-12-24 DIAGNOSIS — M25561 Pain in right knee: Secondary | ICD-10-CM

## 2016-12-24 LAB — T-HELPER CELLS (CD4) COUNT (NOT AT ARMC)
CD4 T CELL HELPER: 32 % — AB (ref 33–55)
CD4 T Cell Abs: 470 /uL (ref 400–2700)

## 2016-12-24 NOTE — Progress Notes (Signed)
PROGRESS NOTE  Eric [name] ZOX:[id number] DOB: Jul 02, 1990 DOA: 12/21/2016 PCP: Patient, No Pcp Per   LOS: 2 days   Brief Narrative / Interim history: Eric [name] is a 26 y.o. male with medical history significant of HIV, depression. Pt presented to [facility] ED on 12/20/2016 with complaints of flulike symptoms and shortness of breath. Symptoms started approximately 6-7 days prior to admission and were associated with shortness of breath, generalized body aches and chills, productive cough. Tylenol with some relief of symptoms. Denies abdominal pain, dysuria, frequency, flank pain, rash, neck stiffness, focal neurological deficit, headache.  Patient's fever curve initially improving, however 11/15 p.m. spiked again, more febrile since last night, ID consulted on 11/16, appreciate input  Assessment & Plan: Active Problems:   HIV (human immunodeficiency virus infection) (HCC)   Multifocal pneumonia   AKI (acute kidney injury) (HCC)   Hypotension   Multifocal pneumonia / Streptococcus pyogenes bacteremia -Narrowed to penicillin, discussed with pharmacy, CD4 still pending, HIV viral load low at 20 -Respiratory viral panel negative -Febrile again last night to 102.7, febrile again this morning 101.2, feels like his fever curve is not improving, infectious disease consulted, discussed with Dr. Luciana Axe, appreciate input -No abdominal pain, no nausea, vomiting or diarrhea.  No skin rashes. -We will repeat the chest x-ray  HIV -new Dx as ov 09/22/16 -continue Biktarvy -CD4 still pending, HIV count low at 20  AKI -Cr 1.3. basleine 0.9. Likely from dehydration.  -His creatinine has now normalized   DVT prophylaxis: Lovenox Code Status: Full code Family Communication: no family in the room Disposition Plan: home when ready   Consultants:   ID  Procedures:   None  Antimicrobials:  Ceftriaxone / Azithromycin 11/13 >> 11/14  Penicillin 11/14   Subjective: -Chills again this  morning, still complains of pleuritic type chest pain with deep breathing, no abdominal pain, nausea vomiting or diarrhea  Objective: Vitals:   12/23/16 2200 12/24/16 0100 12/24/16 0500 12/24/16 0744  BP: 130/84 127/81 128/83 130/73  Pulse: 97 (!) 101 100 (!) 108  Resp: 18 18 18 18   Temp: 99.7 F (37.6 C) 100.2 F (37.9 C) (!) 101.2 F (38.4 C) 99.8 F (37.7 C)  TempSrc: Oral Oral Oral Oral  SpO2: 99% 96% 93% 98%  Weight:      Height:        Intake/Output Summary (Last 24 hours) at 12/24/2016 1046 Last data filed at 12/24/2016 0349 Gross per 24 hour  Intake 1150 ml  Output 1700 ml  Net -550 ml   Filed Weights   12/20/16 2317  Weight: 99.8 kg (220 lb)    Examination:  Constitutional: NAD Eyes: No scleral icterus, lids and conjunctivae normal ENMT: Moist mucous membranes Respiratory: Clear to auscultation bilaterally, no wheezing, no crackles Cardiovascular: Regular rate and rhythm, no murmurs.  No lower extremity edema Abdomen: Nontender, nondistended, bowel sounds positive. Neurologic: No focal finding  Data Reviewed: I have independently reviewed following labs and imaging studies  CBC: Recent Labs  Lab 12/20/16 2316 12/22/16 0319 12/23/16 0319  WBC 12.8* 9.5 7.8  NEUTROABS 10.1*  --   --   HGB 13.6 11.9* 11.8*  HCT 39.0 33.9* 34.2*  MCV 90.7 90.2 91.0  PLT 226 238 315   Basic Metabolic Panel: Recent Labs  Lab 12/20/16 2316 12/22/16 0319 12/23/16 0319  NA 132* 135 133*  K 3.6 3.7 4.0  CL 99* 105 102  CO2 25 23 24   GLUCOSE 115* 101* 107*  BUN 8 5* 5*  CREATININE 1.30* 1.03 1.08  CALCIUM 8.3* 8.0* 7.7*   GFR: Estimated Creatinine Clearance: 131.1 mL/min (by C-G formula based on SCr of 1.08 mg/dL). Liver Function Tests: Recent Labs  Lab 12/20/16 2316  AST 28  ALT 35  ALKPHOS 103  BILITOT 1.0  PROT 7.4  ALBUMIN 2.8*   No results for input(s): LIPASE, AMYLASE in the last 168 hours. No results for input(s): AMMONIA in the last 168  hours. Coagulation Profile: No results for input(s): INR, PROTIME in the last 168 hours. Cardiac Enzymes: No results for input(s): CKTOTAL, CKMB, CKMBINDEX, TROPONINI in the last 168 hours. BNP (last 3 results) No results for input(s): PROBNP in the last 8760 hours. HbA1C: No results for input(s): HGBA1C in the last 72 hours. CBG: No results for input(s): GLUCAP in the last 168 hours. Lipid Profile: No results for input(s): CHOL, HDL, LDLCALC, TRIG, CHOLHDL, LDLDIRECT in the last 72 hours. Thyroid Function Tests: No results for input(s): TSH, T4TOTAL, FREET4, T3FREE, THYROIDAB in the last 72 hours. Anemia Panel: No results for input(s): VITAMINB12, FOLATE, FERRITIN, TIBC, IRON, RETICCTPCT in the last 72 hours. Urine analysis:    Component Value Date/Time   COLORURINE YELLOW 09/22/2016 1701   APPEARANCEUR CLEAR 09/22/2016 1701   LABSPEC 1.015 09/22/2016 1701   PHURINE 6.0 09/22/2016 1701   GLUCOSEU NEGATIVE 09/22/2016 1701   HGBUR NEGATIVE 09/22/2016 1701   BILIRUBINUR NEGATIVE 09/22/2016 1701   KETONESUR TRACE (A) 09/22/2016 1701   PROTEINUR NEGATIVE 09/22/2016 1701   NITRITE NEGATIVE 09/22/2016 1701   LEUKOCYTESUR NEGATIVE 09/22/2016 1701   Sepsis Labs: Invalid input(s): PROCALCITONIN, LACTICIDVEN  Recent Results (from the past 240 hour(s))  Blood culture (routine x 2)     Status: Abnormal   Collection Time: 12/21/16  7:50 AM  Result Value Ref Range Status   Specimen Description BLOOD RIGHT ANTECUBITAL  Final   Special Requests   Final    BOTTLES DRAWN AEROBIC AND ANAEROBIC Blood Culture adequate volume   Culture  Setup Time   Final    GRAM POSITIVE COCCI IN CHAINS AEROBIC BOTTLE ONLY CRITICAL RESULT CALLED TO, READ BACK BY AND VERIFIED WITH: Alex GardenerK. WEIGLE PHARMD, AT [phone] 12/22/16 BY D. VANHOOK    Culture STREPTOCOCCUS PYOGENES (A)  Final   Report Status 12/24/2016 FINAL  Final   Organism ID, Bacteria STREPTOCOCCUS PYOGENES  Final      Susceptibility   Streptococcus  pyogenes - MIC*    PENICILLIN <=0.06 SENSITIVE Sensitive     CEFTRIAXONE <=0.12 INTERMEDIATE Intermediate     ERYTHROMYCIN 2 RESISTANT Resistant     LEVOFLOXACIN 0.5 SENSITIVE Sensitive     VANCOMYCIN 0.5 SENSITIVE Sensitive     * STREPTOCOCCUS PYOGENES  Blood Culture ID Panel (Reflexed)     Status: Abnormal   Collection Time: 12/21/16  7:50 AM  Result Value Ref Range Status   Enterococcus species NOT DETECTED NOT DETECTED Final   Listeria monocytogenes NOT DETECTED NOT DETECTED Final   Staphylococcus species NOT DETECTED NOT DETECTED Final   Staphylococcus aureus NOT DETECTED NOT DETECTED Final   Streptococcus species DETECTED (A) NOT DETECTED Final    Comment: CRITICAL RESULT CALLED TO, READ BACK BY AND VERIFIED WITH: Alex GardenerK. WEIGLE PHARMD, AT [phone] 12/22/16 BY D. VANHOOK    Streptococcus agalactiae NOT DETECTED NOT DETECTED Final   Streptococcus pneumoniae NOT DETECTED NOT DETECTED Final   Streptococcus pyogenes DETECTED (A) NOT DETECTED Final    Comment: CRITICAL RESULT CALLED TO, READ BACK BY AND VERIFIED WITH: Alex GardenerK. WEIGLE PHARMD,  AT [phone] 12/22/16 BY D. VANHOOK    Acinetobacter baumannii NOT DETECTED NOT DETECTED Final   Enterobacteriaceae species NOT DETECTED NOT DETECTED Final   Enterobacter cloacae complex NOT DETECTED NOT DETECTED Final   Escherichia coli NOT DETECTED NOT DETECTED Final   Klebsiella oxytoca NOT DETECTED NOT DETECTED Final   Klebsiella pneumoniae NOT DETECTED NOT DETECTED Final   Proteus species NOT DETECTED NOT DETECTED Final   Serratia marcescens NOT DETECTED NOT DETECTED Final   Haemophilus influenzae NOT DETECTED NOT DETECTED Final   Neisseria meningitidis NOT DETECTED NOT DETECTED Final   Pseudomonas aeruginosa NOT DETECTED NOT DETECTED Final   Candida albicans NOT DETECTED NOT DETECTED Final   Candida glabrata NOT DETECTED NOT DETECTED Final   Candida krusei NOT DETECTED NOT DETECTED Final   Candida parapsilosis NOT DETECTED NOT DETECTED Final   Candida  tropicalis NOT DETECTED NOT DETECTED Final  Blood culture (routine x 2)     Status: None (Preliminary result)   Collection Time: 12/21/16  7:59 AM  Result Value Ref Range Status   Specimen Description BLOOD RIGHT WRIST  Final   Special Requests IN PEDIATRIC BOTTLE Blood Culture adequate volume  Final   Culture NO GROWTH 2 DAYS  Final   Report Status PENDING  Incomplete  Respiratory Panel by PCR     Status: None   Collection Time: 12/21/16  2:47 PM  Result Value Ref Range Status   Adenovirus NOT DETECTED NOT DETECTED Final   Coronavirus 229E NOT DETECTED NOT DETECTED Final   Coronavirus HKU1 NOT DETECTED NOT DETECTED Final   Coronavirus NL63 NOT DETECTED NOT DETECTED Final   Coronavirus OC43 NOT DETECTED NOT DETECTED Final   Metapneumovirus NOT DETECTED NOT DETECTED Final   Rhinovirus / Enterovirus NOT DETECTED NOT DETECTED Final   Influenza A NOT DETECTED NOT DETECTED Final   Influenza B NOT DETECTED NOT DETECTED Final   Parainfluenza Virus 1 NOT DETECTED NOT DETECTED Final   Parainfluenza Virus 2 NOT DETECTED NOT DETECTED Final   Parainfluenza Virus 3 NOT DETECTED NOT DETECTED Final   Parainfluenza Virus 4 NOT DETECTED NOT DETECTED Final   Respiratory Syncytial Virus NOT DETECTED NOT DETECTED Final   Bordetella pertussis NOT DETECTED NOT DETECTED Final   Chlamydophila pneumoniae NOT DETECTED NOT DETECTED Final   Mycoplasma pneumoniae NOT DETECTED NOT DETECTED Final      Radiology Studies: No results found.   Scheduled Meds: . bictegravir-emtricitabine-tenofovir AF  1 tablet Oral Daily  . enoxaparin (LOVENOX) injection  40 mg Subcutaneous Q24H   Continuous Infusions: . pencillin G potassium IV Stopped (12/24/16 0957)    Eric Pertostin Darlisha Kelm, MD, PhD [facility] Pager [phone]  If 7PM-7AM, please contact night-coverage [email or web address] Password [email or web address] 12/24/2016, 10:46 AM

## 2016-12-24 NOTE — Progress Notes (Signed)
Transporter attempted to transfer pt in wheelchair to xray but during travel pt stated that he felt weakness and dizziness. Pt put back to bed. VS assessed. Neuro intact. No noted distress. Xray rescheduled at a later time. Will monitor.

## 2016-12-24 NOTE — Consult Note (Addendum)
[facility]       Reason for Consult: pneumonia    Referring Physician: Dr. Elvera LennoxGherghe  Active Problems:   HIV (human immunodeficiency virus infection) (HCC)   Multifocal pneumonia   AKI (acute kidney injury) (HCC)   Hypotension   . bictegravir-emtricitabine-tenofovir AF  1 tablet Oral Daily  . enoxaparin (LOVENOX) injection  40 mg Subcutaneous Q24H    Recommendations: CXR as ordered Continue penicillin  Continue biktarvy  Dr. Daiva EvesVan Dam will follow over the weekend.  Assessment: He has pneumonia with bilateral multifocal involvement on CT.  He has significant DOE, persistent fever.  On exam he appears to have diminished sounds L>R and concern for effusion.  Also his GAS is resistant or intermediate to his initial appropriate regimen.  He may need thoracentesis if effusion confirmed and significant enough.   Some hilar lymph nodes.  May be reactive.  Will monitor.    Antibiotics: Total antibiotic days 5 Penicillin day 3  HPI: Eric [name] is a 26 y.o. male with recent diagnosis of HIV followed in our clinic on Biktarvy who came in 11/12 with sob, fever.  Had initial sore throat.  No sick contacts.  Blood culture with GAS, ceftriaxone intermediate, erythromycin resistant.  Fever intially decreased and WBC normalized but developed a fever to 102.7 yesterday. He feels his DOE is worse since admission.  No chills.  No associated rash. Some bilateral knee pain.   CT independently revieiwed and a very minimal amount of effusion noted, + opacity confirmed.    Review of Systems:  Constitutional: positive for fevers or negative for chills and anorexia Respiratory: positive for dyspnea on exertion, negative for cough or sputum Gastrointestinal: negative for nausea and diarrhea Integument/breast: negative for rash Musculoskeletal: negative for arthralgias and negative for arthritis All other systems reviewed and are negative    Past Medical History:    Diagnosis Date  . Depression 09/22/2016  . Glaucoma    currently untreated   . HIV (human immunodeficiency virus infection) (HCC) 09/22/2016    Social History   Tobacco Use  . Smoking status: Never Smoker  . Smokeless tobacco: Never Used  Substance Use Topics  . Alcohol use: Yes    Comment: social  . Drug use: No    Family History  Problem Relation Age of Onset  . Hypertension Father   . Diabetes Father   . Healthy Brother   . Hypertension Maternal Grandmother   . Kidney disease Maternal Grandmother     No Known Allergies  Physical Exam: Constitutional: in no apparent distress and alert  Vitals:   12/24/16 0500 12/24/16 0744  BP: 128/83 130/73  Pulse: 100 (!) 108  Resp: 18 18  Temp: (!) 101.2 F (38.4 C) 99.8 F (37.7 C)  SpO2: 93% 98%   EYES: anicteric ENMT: no thrush Cardiovascular: Cor RRR Respiratory: CTA B; normal respiratory effort GI: Bowel sounds are normal, liver is not enlarged, spleen is not enlarged Musculoskeletal: no pedal edema noted; bilateral knees without effusion Skin: negatives: no rash Hematologic: no cervical lad  Lab Results  Component Value Date   WBC 7.8 12/23/2016   HGB 11.8 (L) 12/23/2016   HCT 34.2 (L) 12/23/2016   MCV 91.0 12/23/2016   PLT 315 12/23/2016    Lab Results  Component Value Date   CREATININE 1.08 12/23/2016   BUN 5 (L) 12/23/2016   NA 133 (L) 12/23/2016   K 4.0 12/23/2016   CL 102 12/23/2016   CO2  24 12/23/2016    Lab Results  Component Value Date   ALT 35 12/20/2016   AST 28 12/20/2016   ALKPHOS 103 12/20/2016     Microbiology: Recent Results (from the past 240 hour(s))  Blood culture (routine x 2)     Status: Abnormal   Collection Time: 12/21/16  7:50 AM  Result Value Ref Range Status   Specimen Description BLOOD RIGHT ANTECUBITAL  Final   Special Requests   Final    BOTTLES DRAWN AEROBIC AND ANAEROBIC Blood Culture adequate volume   Culture  Setup Time   Final    GRAM POSITIVE COCCI IN  CHAINS AEROBIC BOTTLE ONLY CRITICAL RESULT CALLED TO, READ BACK BY AND VERIFIED WITH: Alex GardenerK. WEIGLE PHARMD, AT [phone] 12/22/16 BY D. VANHOOK    Culture STREPTOCOCCUS PYOGENES (A)  Final   Report Status 12/24/2016 FINAL  Final   Organism ID, Bacteria STREPTOCOCCUS PYOGENES  Final      Susceptibility   Streptococcus pyogenes - MIC*    PENICILLIN <=0.06 SENSITIVE Sensitive     CEFTRIAXONE <=0.12 INTERMEDIATE Intermediate     ERYTHROMYCIN 2 RESISTANT Resistant     LEVOFLOXACIN 0.5 SENSITIVE Sensitive     VANCOMYCIN 0.5 SENSITIVE Sensitive     * STREPTOCOCCUS PYOGENES  Blood Culture ID Panel (Reflexed)     Status: Abnormal   Collection Time: 12/21/16  7:50 AM  Result Value Ref Range Status   Enterococcus species NOT DETECTED NOT DETECTED Final   Listeria monocytogenes NOT DETECTED NOT DETECTED Final   Staphylococcus species NOT DETECTED NOT DETECTED Final   Staphylococcus aureus NOT DETECTED NOT DETECTED Final   Streptococcus species DETECTED (A) NOT DETECTED Final    Comment: CRITICAL RESULT CALLED TO, READ BACK BY AND VERIFIED WITH: Alex GardenerK. WEIGLE PHARMD, AT [phone] 12/22/16 BY D. VANHOOK    Streptococcus agalactiae NOT DETECTED NOT DETECTED Final   Streptococcus pneumoniae NOT DETECTED NOT DETECTED Final   Streptococcus pyogenes DETECTED (A) NOT DETECTED Final    Comment: CRITICAL RESULT CALLED TO, READ BACK BY AND VERIFIED WITH: Alex GardenerK. WEIGLE PHARMD, AT [phone] 12/22/16 BY D. VANHOOK    Acinetobacter baumannii NOT DETECTED NOT DETECTED Final   Enterobacteriaceae species NOT DETECTED NOT DETECTED Final   Enterobacter cloacae complex NOT DETECTED NOT DETECTED Final   Escherichia coli NOT DETECTED NOT DETECTED Final   Klebsiella oxytoca NOT DETECTED NOT DETECTED Final   Klebsiella pneumoniae NOT DETECTED NOT DETECTED Final   Proteus species NOT DETECTED NOT DETECTED Final   Serratia marcescens NOT DETECTED NOT DETECTED Final   Haemophilus influenzae NOT DETECTED NOT DETECTED Final   Neisseria  meningitidis NOT DETECTED NOT DETECTED Final   Pseudomonas aeruginosa NOT DETECTED NOT DETECTED Final   Candida albicans NOT DETECTED NOT DETECTED Final   Candida glabrata NOT DETECTED NOT DETECTED Final   Candida krusei NOT DETECTED NOT DETECTED Final   Candida parapsilosis NOT DETECTED NOT DETECTED Final   Candida tropicalis NOT DETECTED NOT DETECTED Final  Blood culture (routine x 2)     Status: None (Preliminary result)   Collection Time: 12/21/16  7:59 AM  Result Value Ref Range Status   Specimen Description BLOOD RIGHT WRIST  Final   Special Requests IN PEDIATRIC BOTTLE Blood Culture adequate volume  Final   Culture NO GROWTH 2 DAYS  Final   Report Status PENDING  Incomplete  Respiratory Panel by PCR     Status: None   Collection Time: 12/21/16  2:47 PM  Result Value Ref Range Status  Adenovirus NOT DETECTED NOT DETECTED Final   Coronavirus 229E NOT DETECTED NOT DETECTED Final   Coronavirus HKU1 NOT DETECTED NOT DETECTED Final   Coronavirus NL63 NOT DETECTED NOT DETECTED Final   Coronavirus OC43 NOT DETECTED NOT DETECTED Final   Metapneumovirus NOT DETECTED NOT DETECTED Final   Rhinovirus / Enterovirus NOT DETECTED NOT DETECTED Final   Influenza A NOT DETECTED NOT DETECTED Final   Influenza B NOT DETECTED NOT DETECTED Final   Parainfluenza Virus 1 NOT DETECTED NOT DETECTED Final   Parainfluenza Virus 2 NOT DETECTED NOT DETECTED Final   Parainfluenza Virus 3 NOT DETECTED NOT DETECTED Final   Parainfluenza Virus 4 NOT DETECTED NOT DETECTED Final   Respiratory Syncytial Virus NOT DETECTED NOT DETECTED Final   Bordetella pertussis NOT DETECTED NOT DETECTED Final   Chlamydophila pneumoniae NOT DETECTED NOT DETECTED Final   Mycoplasma pneumoniae NOT DETECTED NOT DETECTED Final    Tyjon Bowen, Molly Maduro, MD [email or web address] [phone] pager  [phone] cell 12/24/2016, 11:23 AM

## 2016-12-24 NOTE — Care Management Note (Signed)
Case Management Note  Patient Details  Name: Bari Handshoe MRN: [id number] Date of Birth: [birth year]  Subjective/Objective:                    Action/Plan: Pt without a PCP. CM met with the patient and he is interested in going to one of the [facility]. CM was able to obtain him an appointment at the [facility]. Information on the AVS and information on [facility] pharmacy on the AVS. CM following.  Expected Discharge Date:                  Expected Discharge Plan:  Home/Self Care  In-House Referral:     Discharge planning Services  CM Consult, Medication Assistance, [facility]  Post Acute Care Choice:    Choice offered to:     DME Arranged:    DME Agency:     HH Arranged:    HH Agency:     Status of Service:  In process, will continue to follow  If discussed at Long Length of Stay Meetings, dates discussed:    Additional Comments:  Pollie Friar, RN 12/24/2016, 11:59 AM

## 2016-12-25 DIAGNOSIS — J918 Pleural effusion in other conditions classified elsewhere: Secondary | ICD-10-CM

## 2016-12-25 DIAGNOSIS — N189 Chronic kidney disease, unspecified: Secondary | ICD-10-CM

## 2016-12-25 DIAGNOSIS — R7881 Bacteremia: Secondary | ICD-10-CM

## 2016-12-25 DIAGNOSIS — A400 Sepsis due to streptococcus, group A: Secondary | ICD-10-CM

## 2016-12-25 NOTE — Progress Notes (Signed)
PROGRESS NOTE  Benedetto GoadLee Bourbon XBJ:[id number] DOB: 08/26/1990 DOA: 12/21/2016 PCP: Patient, No Pcp Per   LOS: 3 days   Brief Narrative / Interim history: Benedetto GoadLee Duch is a 26 y.o. male with medical history significant of HIV, depression. [facility]2016 with complaints of flulike symptoms and shortness of breath. Symptoms started approximately 6-7 days prior to admission and were associated with shortness of breath, generalized body aches and chills, productive cough. Tylenol with some relief of symptoms. Denies abdominal pain, dysuria, frequency, flank pain, rash, neck stiffness, focal neurological deficit, headache.  Patient's fever curve initially improving, however 11/15 p.m. spiked again, more febrile since last night, ID consulted on 11/16, appreciate input  Assessment & Plan: Active Problems:   HIV (human immunodeficiency virus infection) (HCC)   Multifocal pneumonia   AKI (acute kidney injury) (HCC)   Hypotension   Sepsis due to Streptococcus, group A (HCC)   Bacteremia   Multifocal pneumonia / Streptococcus pyogenes bacteremia -Narrowed to penicillin, discussed with pharmacy, CD4 still pending, HIV viral load low at 20 -Respiratory viral panel negative -Febrile again last night to 102.7, febrile again this morning 101.2, feels like his fever curve is not improving, infectious disease consulted, discussed with Dr. Luciana Axeomer, appreciate input -No abdominal pain, no nausea, vomiting or diarrhea.  No skin rashes. -chest x-ray on 11/16 with small to moderate left pleural effusion,  Repeat cxr in am with lateral decubitus, consider us guided thoracentesis  HIV -new Dx as Sudie Grumblingov 09/22/16 -continue Biktarvy -CD4 still pending, HIV count low at 20  AKI -Cr 1.3. basleine 0.9. Likely from dehydration.  -His creatinine has now normalized   DVT prophylaxis: Lovenox Code Status: Full code Family Communication: no family in the room Disposition Plan: home when  ready   Consultants:   ID  Procedures:   None  Antimicrobials:  Ceftriaxone / Azithromycin 11/13 >> 11/14  Penicillin 11/14   Subjective: -feeling better, less left sided pleuritic chest pain, report cough now become productive   Objective: Vitals:   12/24/16 1134 12/24/16 1905 12/25/16 0501 12/25/16 0953  BP: 134/85 (!) 141/89 129/83 128/81  Pulse: 95 94 83 99  Resp: 18 18 18 18   Temp: 99.7 F (37.6 C) 99 F (37.2 C) 99.8 F (37.7 C) 100.2 F (37.9 C)  TempSrc: Oral Oral Oral Oral  SpO2: 99% 99% 100% 99%  Weight:      Height:        Intake/Output Summary (Last 24 hours) at 12/25/2016 1040 Last data filed at 12/25/2016 0900 Gross per 24 hour  Intake 2490 ml  Output 4100 ml  Net -1610 ml   Filed Weights   12/20/16 2317  Weight: 99.8 kg (220 lb)    Examination:  Constitutional: NAD Eyes: No scleral icterus, lids and conjunctivae normal ENMT: Moist mucous membranes Respiratory: diminished left base, no wheezing, no crackles Cardiovascular: Regular rate and rhythm, no murmurs.  No lower extremity edema Abdomen: Nontender, nondistended, bowel sounds positive. Neurologic: No focal finding  Data Reviewed: I have independently reviewed following labs and imaging studies  CBC: Recent Labs  Lab 12/20/16 2316 12/22/16 0319 12/23/16 0319  WBC 12.8* 9.5 7.8  NEUTROABS 10.1*  --   --   HGB 13.6 11.9* 11.8*  HCT 39.0 33.9* 34.2*  MCV 90.7 90.2 91.0  PLT 226 238 315   Basic Metabolic Panel: Recent Labs  Lab 12/20/16 2316 12/22/16 0319 12/23/16 0319  NA 132* 135 133*  K 3.6 3.7 4.0  CL 99* 105 102  CO2 25 23 24   GLUCOSE 115* 101* 107*  BUN 8 5* 5*  CREATININE 1.30* 1.03 1.08  CALCIUM 8.3* 8.0* 7.7*   GFR: Estimated Creatinine Clearance: 131.1 mL/min (by C-G formula based on SCr of 1.08 mg/dL). Liver Function Tests: Recent Labs  Lab 12/20/16 2316  AST 28  ALT 35  ALKPHOS 103  BILITOT 1.0  PROT 7.4  ALBUMIN 2.8*   No results for  input(s): LIPASE, AMYLASE in the last 168 hours. No results for input(s): AMMONIA in the last 168 hours. Coagulation Profile: No results for input(s): INR, PROTIME in the last 168 hours. Cardiac Enzymes: No results for input(s): CKTOTAL, CKMB, CKMBINDEX, TROPONINI in the last 168 hours. BNP (last 3 results) No results for input(s): PROBNP in the last 8760 hours. HbA1C: No results for input(s): HGBA1C in the last 72 hours. CBG: No results for input(s): GLUCAP in the last 168 hours. Lipid Profile: No results for input(s): CHOL, HDL, LDLCALC, TRIG, CHOLHDL, LDLDIRECT in the last 72 hours. Thyroid Function Tests: No results for input(s): TSH, T4TOTAL, FREET4, T3FREE, THYROIDAB in the last 72 hours. Anemia Panel: No results for input(s): VITAMINB12, FOLATE, FERRITIN, TIBC, IRON, RETICCTPCT in the last 72 hours. Urine analysis:    Component Value Date/Time   COLORURINE YELLOW 09/22/2016 1701   APPEARANCEUR CLEAR 09/22/2016 1701   LABSPEC 1.015 09/22/2016 1701   PHURINE 6.0 09/22/2016 1701   GLUCOSEU NEGATIVE 09/22/2016 1701   HGBUR NEGATIVE 09/22/2016 1701   BILIRUBINUR NEGATIVE 09/22/2016 1701   KETONESUR TRACE (A) 09/22/2016 1701   PROTEINUR NEGATIVE 09/22/2016 1701   NITRITE NEGATIVE 09/22/2016 1701   LEUKOCYTESUR NEGATIVE 09/22/2016 1701   Sepsis Labs: Invalid input(s): PROCALCITONIN, LACTICIDVEN  Recent Results (from the past 240 hour(s))  Blood culture (routine x 2)     Status: Abnormal   Collection Time: 12/21/16  7:50 AM  Result Value Ref Range Status   Specimen Description BLOOD RIGHT ANTECUBITAL  Final   Special Requests   Final    BOTTLES DRAWN AEROBIC AND ANAEROBIC Blood Culture adequate volume   Culture  Setup Time   Final    GRAM POSITIVE COCCI IN CHAINS AEROBIC BOTTLE ONLY CRITICAL RESULT CALLED TO, READ BACK BY AND VERIFIED WITH: Alex Gardener PHARMD, AT 1610 12/22/16 BY D. VANHOOK    Culture STREPTOCOCCUS PYOGENES (A)  Final   Report Status 12/24/2016 FINAL   Final   Organism ID, Bacteria STREPTOCOCCUS PYOGENES  Final      Susceptibility   Streptococcus pyogenes - MIC*    PENICILLIN <=0.06 SENSITIVE Sensitive     CEFTRIAXONE <=0.12 INTERMEDIATE Intermediate     ERYTHROMYCIN 2 RESISTANT Resistant     LEVOFLOXACIN 0.5 SENSITIVE Sensitive     VANCOMYCIN 0.5 SENSITIVE Sensitive     * STREPTOCOCCUS PYOGENES  Blood Culture ID Panel (Reflexed)     Status: Abnormal   Collection Time: 12/21/16  7:50 AM  Result Value Ref Range Status   Enterococcus species NOT DETECTED NOT DETECTED Final   Listeria monocytogenes NOT DETECTED NOT DETECTED Final   Staphylococcus species NOT DETECTED NOT DETECTED Final   Staphylococcus aureus NOT DETECTED NOT DETECTED Final   Streptococcus species DETECTED (A) NOT DETECTED Final    Comment: CRITICAL RESULT CALLED TO, READ BACK BY AND VERIFIED WITH: KGilman Buttner PHARMD, AT 9604 12/22/16 BY D. VANHOOK    Streptococcus agalactiae NOT DETECTED NOT DETECTED Final   Streptococcus pneumoniae NOT DETECTED NOT DETECTED Final   Streptococcus pyogenes DETECTED (A)  NOT DETECTED Final    Comment: CRITICAL RESULT CALLED TO, READ BACK BY AND VERIFIED WITH: Alex GardenerK. WEIGLE PHARMD, AT [phone] 12/22/16 BY D. VANHOOK    Acinetobacter baumannii NOT DETECTED NOT DETECTED Final   Enterobacteriaceae species NOT DETECTED NOT DETECTED Final   Enterobacter cloacae complex NOT DETECTED NOT DETECTED Final   Escherichia coli NOT DETECTED NOT DETECTED Final   Klebsiella oxytoca NOT DETECTED NOT DETECTED Final   Klebsiella pneumoniae NOT DETECTED NOT DETECTED Final   Proteus species NOT DETECTED NOT DETECTED Final   Serratia marcescens NOT DETECTED NOT DETECTED Final   Haemophilus influenzae NOT DETECTED NOT DETECTED Final   Neisseria meningitidis NOT DETECTED NOT DETECTED Final   Pseudomonas aeruginosa NOT DETECTED NOT DETECTED Final   Candida albicans NOT DETECTED NOT DETECTED Final   Candida glabrata NOT DETECTED NOT DETECTED Final   Candida krusei  NOT DETECTED NOT DETECTED Final   Candida parapsilosis NOT DETECTED NOT DETECTED Final   Candida tropicalis NOT DETECTED NOT DETECTED Final  Blood culture (routine x 2)     Status: None (Preliminary result)   Collection Time: 12/21/16  7:59 AM  Result Value Ref Range Status   Specimen Description BLOOD RIGHT WRIST  Final   Special Requests IN PEDIATRIC BOTTLE Blood Culture adequate volume  Final   Culture NO GROWTH 3 DAYS  Final   Report Status PENDING  Incomplete  Respiratory Panel by PCR     Status: None   Collection Time: 12/21/16  2:47 PM  Result Value Ref Range Status   Adenovirus NOT DETECTED NOT DETECTED Final   Coronavirus 229E NOT DETECTED NOT DETECTED Final   Coronavirus HKU1 NOT DETECTED NOT DETECTED Final   Coronavirus NL63 NOT DETECTED NOT DETECTED Final   Coronavirus OC43 NOT DETECTED NOT DETECTED Final   Metapneumovirus NOT DETECTED NOT DETECTED Final   Rhinovirus / Enterovirus NOT DETECTED NOT DETECTED Final   Influenza A NOT DETECTED NOT DETECTED Final   Influenza B NOT DETECTED NOT DETECTED Final   Parainfluenza Virus 1 NOT DETECTED NOT DETECTED Final   Parainfluenza Virus 2 NOT DETECTED NOT DETECTED Final   Parainfluenza Virus 3 NOT DETECTED NOT DETECTED Final   Parainfluenza Virus 4 NOT DETECTED NOT DETECTED Final   Respiratory Syncytial Virus NOT DETECTED NOT DETECTED Final   Bordetella pertussis NOT DETECTED NOT DETECTED Final   Chlamydophila pneumoniae NOT DETECTED NOT DETECTED Final   Mycoplasma pneumoniae NOT DETECTED NOT DETECTED Final      Radiology Studies: Dg Chest 2 View  Result Date: 12/24/2016 CLINICAL DATA:  Pneumonia and fever. Dry cough that began productive today. EXAM: CHEST  2 VIEW COMPARISON:  CT 3 days ago. FINDINGS: Small to moderate left pleural effusion that is increased from 3 days ago. Left lower lobe pneumonia seen previously is obscured. Normal heart size. No visible cavitation. IMPRESSION: Small to moderate left pleural effusion  that is progressed from 3 days ago. Electronically Signed   By: Marnee SpringJonathon  Watts M.D.   On: 12/24/2016 20:03     Scheduled Meds: . bictegravir-emtricitabine-tenofovir AF  1 tablet Oral Daily  . enoxaparin (LOVENOX) injection  40 mg Subcutaneous Q24H   Continuous Infusions: . pencillin G potassium IV Stopped (12/25/16 [id number])    Albertine GratesFang Aelyn Stanaland, MD, PhD [facility] Pager [phone]  If 7PM-7AM, please contact night-coverage [email or web address] Password [email or web address] 12/25/2016, 10:40 AM

## 2016-12-25 NOTE — Progress Notes (Signed)
Subjective: No new complaints   Antibiotics:  Anti-infectives (From admission, onward)   Start     Dose/Rate Route Frequency Ordered Stop   12/22/16 1100  azithromycin (ZITHROMAX) 500 mg in dextrose 5 % 250 mL IVPB  Status:  Discontinued     500 mg 250 mL/hr over 60 Minutes Intravenous Every 24 hours 12/21/16 1133 12/22/16 0732   12/22/16 0930  cefTRIAXone (ROCEPHIN) 1 g in dextrose 5 % 50 mL IVPB  Status:  Discontinued     1 g 100 mL/hr over 30 Minutes Intravenous Every 24 hours 12/21/16 1133 12/22/16 0732   12/22/16 0800  penicillin G potassium 4 Million Units in dextrose 5 % 250 mL IVPB     4 Million Units 250 mL/hr over 60 Minutes Intravenous Every 4 hours 12/22/16 0732     12/21/16 1230  bictegravir-emtricitabine-tenofovir AF (BIKTARVY) 50-200-25 MG per tablet 1 tablet    Comments:  Try to take at the same time each day with or without food.     1 tablet Oral Daily 12/21/16 1133     12/21/16 0930  azithromycin (ZITHROMAX) 500 mg in dextrose 5 % 250 mL IVPB     500 mg 250 mL/hr over 60 Minutes Intravenous  Once 12/21/16 0929 12/21/16 1137   12/21/16 0730  cefTRIAXone (ROCEPHIN) 2 g in dextrose 5 % 50 mL IVPB     2 g 100 mL/hr over 30 Minutes Intravenous  Once 12/21/16 0723 12/21/16 0951      Medications: Scheduled Meds: . bictegravir-emtricitabine-tenofovir AF  1 tablet Oral Daily  . enoxaparin (LOVENOX) injection  40 mg Subcutaneous Q24H   Continuous Infusions: . pencillin G potassium IV Stopped (12/25/16 0906)   PRN Meds:.acetaminophen, ondansetron **OR** ondansetron (ZOFRAN) IV, traMADol    Objective: Weight change:   Intake/Output Summary (Last 24 hours) at 12/25/2016 1028 Last data filed at 12/25/2016 0900 Gross per 24 hour  Intake 2490 ml  Output 4100 ml  Net -1610 ml   Blood pressure 128/81, pulse 99, temperature 100.2 F (37.9 C), temperature source Oral, resp. rate 18, height 6\' 1"  (1.854 m), weight 220 lb (99.8 kg), SpO2 99 %. Temp:  [99  F (37.2 C)-100.2 F (37.9 C)] 100.2 F (37.9 C) (11/17 0953) Pulse Rate:  [83-99] 99 (11/17 0953) Resp:  [18] 18 (11/17 0953) BP: (128-141)/(81-89) 128/81 (11/17 0953) SpO2:  [99 %-100 %] 99 % (11/17 0953)  Physical Exam: General: Alert and awake, oriented x3, not in any acute distress. HEENT: anicteric sclera, pupils reactive to light and accommodation, EOMI CVS regular rate, normal r,  no murmur rubs or gallops Chest: Diminished breath sounds at the left base with egophony and dullness to percussion Abdomen: soft , nondistended, normal bowel sounds, Extremities: no  clubbing or edema noted bilaterally Skin: no rashes Neuro: nonfocal  CBC:  CBC Latest Ref Rng & Units 12/23/2016 12/22/2016 12/20/2016  WBC 4.0 - 10.5 K/uL 7.8 9.5 12.8(H)  Hemoglobin 13.0 - 17.0 g/dL 11.8(L) 11.9(L) 13.6  Hematocrit 39.0 - 52.0 % 34.2(L) 33.9(L) 39.0  Platelets 150 - 400 K/uL 315 238 226     BMET Recent Labs    12/23/16 0319  NA 133*  K 4.0  CL 102  CO2 24  GLUCOSE 107*  BUN 5*  CREATININE 1.08  CALCIUM 7.7*     Liver Panel  No results for input(s): PROT, ALBUMIN, AST, ALT, ALKPHOS, BILITOT, BILIDIR, IBILI in the last 72 hours.     Sedimentation  Rate No results for input(s): ESRSEDRATE in the last 72 hours. C-Reactive Protein No results for input(s): CRP in the last 72 hours.  Micro Results: Recent Results (from the past 720 hour(s))  Blood culture (routine x 2)     Status: Abnormal   Collection Time: 12/21/16  7:50 AM  Result Value Ref Range Status   Specimen Description BLOOD RIGHT ANTECUBITAL  Final   Special Requests   Final    BOTTLES DRAWN AEROBIC AND ANAEROBIC Blood Culture adequate volume   Culture  Setup Time   Final    GRAM POSITIVE COCCI IN CHAINS AEROBIC BOTTLE ONLY CRITICAL RESULT CALLED TO, READ BACK BY AND VERIFIED WITH: Alex GardenerK. WEIGLE PHARMD, AT [phone] 12/22/16 BY D. VANHOOK    Culture STREPTOCOCCUS PYOGENES (A)  Final   Report Status 12/24/2016 FINAL   Final   Organism ID, Bacteria STREPTOCOCCUS PYOGENES  Final      Susceptibility   Streptococcus pyogenes - MIC*    PENICILLIN <=0.06 SENSITIVE Sensitive     CEFTRIAXONE <=0.12 INTERMEDIATE Intermediate     ERYTHROMYCIN 2 RESISTANT Resistant     LEVOFLOXACIN 0.5 SENSITIVE Sensitive     VANCOMYCIN 0.5 SENSITIVE Sensitive     * STREPTOCOCCUS PYOGENES  Blood Culture ID Panel (Reflexed)     Status: Abnormal   Collection Time: 12/21/16  7:50 AM  Result Value Ref Range Status   Enterococcus species NOT DETECTED NOT DETECTED Final   Listeria monocytogenes NOT DETECTED NOT DETECTED Final   Staphylococcus species NOT DETECTED NOT DETECTED Final   Staphylococcus aureus NOT DETECTED NOT DETECTED Final   Streptococcus species DETECTED (A) NOT DETECTED Final    Comment: CRITICAL RESULT CALLED TO, READ BACK BY AND VERIFIED WITH: Alex GardenerK. WEIGLE PHARMD, AT [phone] 12/22/16 BY D. VANHOOK    Streptococcus agalactiae NOT DETECTED NOT DETECTED Final   Streptococcus pneumoniae NOT DETECTED NOT DETECTED Final   Streptococcus pyogenes DETECTED (A) NOT DETECTED Final    Comment: CRITICAL RESULT CALLED TO, READ BACK BY AND VERIFIED WITH: Alex GardenerK. WEIGLE PHARMD, AT [phone] 12/22/16 BY D. VANHOOK    Acinetobacter baumannii NOT DETECTED NOT DETECTED Final   Enterobacteriaceae species NOT DETECTED NOT DETECTED Final   Enterobacter cloacae complex NOT DETECTED NOT DETECTED Final   Escherichia coli NOT DETECTED NOT DETECTED Final   Klebsiella oxytoca NOT DETECTED NOT DETECTED Final   Klebsiella pneumoniae NOT DETECTED NOT DETECTED Final   Proteus species NOT DETECTED NOT DETECTED Final   Serratia marcescens NOT DETECTED NOT DETECTED Final   Haemophilus influenzae NOT DETECTED NOT DETECTED Final   Neisseria meningitidis NOT DETECTED NOT DETECTED Final   Pseudomonas aeruginosa NOT DETECTED NOT DETECTED Final   Candida albicans NOT DETECTED NOT DETECTED Final   Candida glabrata NOT DETECTED NOT DETECTED Final   Candida krusei  NOT DETECTED NOT DETECTED Final   Candida parapsilosis NOT DETECTED NOT DETECTED Final   Candida tropicalis NOT DETECTED NOT DETECTED Final  Blood culture (routine x 2)     Status: None (Preliminary result)   Collection Time: 12/21/16  7:59 AM  Result Value Ref Range Status   Specimen Description BLOOD RIGHT WRIST  Final   Special Requests IN PEDIATRIC BOTTLE Blood Culture adequate volume  Final   Culture NO GROWTH 3 DAYS  Final   Report Status PENDING  Incomplete  Respiratory Panel by PCR     Status: None   Collection Time: 12/21/16  2:47 PM  Result Value Ref Range Status   Adenovirus NOT DETECTED NOT  DETECTED Final   Coronavirus 229E NOT DETECTED NOT DETECTED Final   Coronavirus HKU1 NOT DETECTED NOT DETECTED Final   Coronavirus NL63 NOT DETECTED NOT DETECTED Final   Coronavirus OC43 NOT DETECTED NOT DETECTED Final   Metapneumovirus NOT DETECTED NOT DETECTED Final   Rhinovirus / Enterovirus NOT DETECTED NOT DETECTED Final   Influenza A NOT DETECTED NOT DETECTED Final   Influenza B NOT DETECTED NOT DETECTED Final   Parainfluenza Virus 1 NOT DETECTED NOT DETECTED Final   Parainfluenza Virus 2 NOT DETECTED NOT DETECTED Final   Parainfluenza Virus 3 NOT DETECTED NOT DETECTED Final   Parainfluenza Virus 4 NOT DETECTED NOT DETECTED Final   Respiratory Syncytial Virus NOT DETECTED NOT DETECTED Final   Bordetella pertussis NOT DETECTED NOT DETECTED Final   Chlamydophila pneumoniae NOT DETECTED NOT DETECTED Final   Mycoplasma pneumoniae NOT DETECTED NOT DETECTED Final    Studies/Results: Dg Chest 2 View  Result Date: 12/24/2016 CLINICAL DATA:  Pneumonia and fever. Dry cough that began productive today. EXAM: CHEST  2 VIEW COMPARISON:  CT 3 days ago. FINDINGS: Small to moderate left pleural effusion that is increased from 3 days ago. Left lower lobe pneumonia seen previously is obscured. Normal heart size. No visible cavitation. IMPRESSION: Small to moderate left pleural effusion that is  progressed from 3 days ago. Electronically Signed   By: Marnee SpringJonathon  Watts M.D.   On: 12/24/2016 20:03      Assessment/Plan:  INTERVAL HISTORY: CXR shows effusion larger   Active Problems:   HIV (human immunodeficiency virus infection) (HCC)   Multifocal pneumonia   AKI (acute kidney injury) (HCC)   Hypotension    Eric [name] is a 26 y.o. male with  Recently diagnosed HIV, well controlled admitted with GAS bacteremia and multifocal PNA with parapneumonic effusion  #1 Group A streptococcal bacteremia: Continue penicillin IV Repeat blood cultures to ensure clearance  #2 PNA with parapneumonic effusion: would discuss with IR being diagnostic and therapeutic thoracocentesis.  If this is not yet done I would definitely repeat another chest x-ray tomorrow along the lateral decubitus film  3 HIV disease: Very nicely controlled on BIKTARVY, DO NOT SHARE THIS DIAGNOSIS WITH ANY FAMILY MEMBER OR NON MEDICAL PROVIDER      LOS: 3 days   Eric [name] 12/25/2016, 10:28 AM

## 2016-12-26 ENCOUNTER — Inpatient Hospital Stay (HOSPITAL_COMMUNITY): Payer: Self-pay

## 2016-12-26 DIAGNOSIS — R7881 Bacteremia: Secondary | ICD-10-CM

## 2016-12-26 DIAGNOSIS — A400 Sepsis due to streptococcus, group A: Secondary | ICD-10-CM

## 2016-12-26 LAB — GRAM STAIN

## 2016-12-26 LAB — CBC
HEMATOCRIT: 36.3 % — AB (ref 39.0–52.0)
HEMOGLOBIN: 12.5 g/dL — AB (ref 13.0–17.0)
MCH: 32 pg (ref 26.0–34.0)
MCHC: 34.4 g/dL (ref 30.0–36.0)
MCV: 92.8 fL (ref 78.0–100.0)
Platelets: 485 10*3/uL — ABNORMAL HIGH (ref 150–400)
RBC: 3.91 MIL/uL — ABNORMAL LOW (ref 4.22–5.81)
RDW: 14 % (ref 11.5–15.5)
WBC: 7.8 10*3/uL (ref 4.0–10.5)

## 2016-12-26 LAB — CULTURE, BLOOD (ROUTINE X 2)
Culture: NO GROWTH
Special Requests: ADEQUATE

## 2016-12-26 LAB — BASIC METABOLIC PANEL
Anion gap: 6 (ref 5–15)
BUN: 7 mg/dL (ref 6–20)
CALCIUM: 8.3 mg/dL — AB (ref 8.9–10.3)
CHLORIDE: 103 mmol/L (ref 101–111)
CO2: 26 mmol/L (ref 22–32)
CREATININE: 0.95 mg/dL (ref 0.61–1.24)
GFR calc non Af Amer: >60 mL/min (ref >60)
Glucose, Bld: 114 mg/dL — ABNORMAL HIGH (ref 65–99)
Potassium: 4.4 mmol/L (ref 3.5–5.1)
SODIUM: 135 mmol/L (ref 135–145)

## 2016-12-26 LAB — BODY FLUID CELL COUNT WITH DIFFERENTIAL
Eos, Fluid: 2 %
LYMPHS FL: 25 %
MONOCYTE-MACROPHAGE-SEROUS FLUID: 41 % — AB (ref 50–90)
Neutrophil Count, Fluid: 32 % — ABNORMAL HIGH (ref 0–25)
Total Nucleated Cell Count, Fluid: 3510 cu mm — ABNORMAL HIGH (ref 0–1000)

## 2016-12-26 LAB — LACTATE DEHYDROGENASE, PLEURAL OR PERITONEAL FLUID: LD FL: 495 U/L — AB (ref 3–23)

## 2016-12-26 LAB — GLUCOSE, PLEURAL OR PERITONEAL FLUID: Glucose, Fluid: 108 mg/dL

## 2016-12-26 LAB — MAGNESIUM: MAGNESIUM: 2 mg/dL (ref 1.7–2.4)

## 2016-12-26 LAB — ALBUMIN, PLEURAL OR PERITONEAL FLUID: ALBUMIN FL: 1.8 g/dL

## 2016-12-26 LAB — PROTEIN, PLEURAL OR PERITONEAL FLUID: Total protein, fluid: 5.1 g/dL

## 2016-12-26 MED ORDER — ENOXAPARIN SODIUM 40 MG/0.4ML ~~LOC~~ SOLN
40.0000 mg | SUBCUTANEOUS | Status: DC
  Administered 2016-12-28: 40 mg via SUBCUTANEOUS
  Filled 2016-12-26: qty 0.4

## 2016-12-26 MED ORDER — LIDOCAINE HCL (PF) 1 % IJ SOLN
INTRAMUSCULAR | Status: AC
  Filled 2016-12-26: qty 10

## 2016-12-26 NOTE — Progress Notes (Signed)
Patient resting in bedside chair call light within reach.  Alert, verbally pleasant.  No complaints of pain or discomfort.  Bandaid to left side back, clean dry and intact.  Family at side and supportive.

## 2016-12-26 NOTE — Procedures (Addendum)
Ultrasound-guided diagnostic and therapeutic left thoracentesis performed yielding 120 cc of hazy, blood-tinged fluid (trace left effusion noted on recent CT chest). No immediate complications. Follow-up chest x-ray pending. The fluid was sent to the lab for preordered studies. Pt did experience mild vasovagal response during procedure which responded well to supine positioning/cool compress to neck/forehead.

## 2016-12-26 NOTE — Progress Notes (Signed)
PROGRESS NOTE  Eric GoadLee [name] JYN:[id number] DOB: 07/11/1990 DOA: 12/21/2016 PCP: Patient, No Pcp Per   LOS: 4 days   Brief Narrative / Interim history: Eric [name] is a 26 y.o. male with medical history significant of HIV, depression. [facility] with complaints of flulike symptoms and shortness of breath. Symptoms started approximately 6-7 days prior to admission and were associated with shortness of breath, generalized body aches and chills, productive cough. Tylenol with some relief of symptoms. Denies abdominal pain, dysuria, frequency, flank pain, rash, neck stiffness, focal neurological deficit, headache.  Patient's fever curve initially improving, however 11/15 p.m. spiked again, more febrile since last night, ID consulted on 11/16, appreciate input  Assessment & Plan: Active Problems:   HIV (human immunodeficiency virus infection) (HCC)   Multifocal pneumonia   AKI (acute kidney injury) (HCC)   Hypotension   Sepsis due to Streptococcus, group A (HCC)   Bacteremia   Multifocal pneumonia/pleural effusion / Streptococcus pyogenes bacteremia -CTA on presentation negative for PE, +Bilateral multifocal Bronchopneumonia with left pleural effusion --Respiratory viral panel negative -last fever on 11/15, No abdominal pain, no nausea, vomiting or diarrhea.  No skin rashes. --he received rocephin/zithrox1 on 11/13 then on penicillin G from 11/14, -repeat blood culture no growth -Repeat cxr on 11/18 with large left pleural effusion, us guided thoracentesis ordered with culture/fluids study/cytology -infectious disease following, input appreciated   HIV -new Dx as ov 09/22/16 --CD4 470, HIV count low at 20 -continue Biktarvy -infectious disease following  AKI -Cr 1.3. basleine 0.9. Likely from dehydration.  -His creatinine has now normalized   DVT prophylaxis: Lovenox (hold for procedure) Code Status: Full code Family Communication: Aunt at  bedside Disposition Plan: home when ready , need ID clearance  Consultants:   ID  Procedures:   [place] guided thoracentesis ordered on 11/18  Antimicrobials:  Ceftriaxone / Azithromycin 11/13 >> 11/14  Penicillin 11/14   Subjective: -feeling better, less left sided pleuritic chest pain, report cough now become productive No fever for the last 48hrs, no hypoxia Aunt at bedside   Objective: Vitals:   12/25/16 1812 12/25/16 2201 12/26/16 0130 12/26/16 0700  BP: 127/82 128/70 113/62 124/66  Pulse: 96 91 84 94  Resp: 18 20 20 20   Temp: 99.6 F (37.6 C) 98.8 F (37.1 C) 98 F (36.7 C) 99.6 F (37.6 C)  TempSrc: Oral Oral Oral Oral  SpO2: 99% 99% 98% 97%  Weight:      Height:        Intake/Output Summary (Last 24 hours) at 12/26/2016 0805 Last data filed at 12/26/2016 0700 Gross per 24 hour  Intake 240 ml  Output 2450 ml  Net -2210 ml   Filed Weights   12/20/16 2317  Weight: 99.8 kg (220 lb)    Examination:  Constitutional: NAD Eyes: No scleral icterus, lids and conjunctivae normal ENMT: Moist mucous membranes Respiratory: diminished left base, no wheezing, no crackles Cardiovascular: Regular rate and rhythm, no murmurs.  No lower extremity edema Abdomen: Nontender, nondistended, bowel sounds positive. Neurologic: No focal finding  Data Reviewed: I have independently reviewed following labs and imaging studies  CBC: Recent Labs  Lab 12/20/16 2316 12/22/16 0319 12/23/16 0319 12/26/16 0450  WBC 12.8* 9.5 7.8 7.8  NEUTROABS 10.1*  --   --   --   HGB 13.6 11.9* 11.8* 12.5*  HCT 39.0 33.9* 34.2* 36.3*  MCV 90.7 90.2 91.0 92.8  PLT 226 238 315 485*   Basic Metabolic Panel:  Recent Labs  Lab 12/20/16 2316 12/22/16 0319 12/23/16 0319 12/26/16 0450  NA 132* 135 133* 135  K 3.6 3.7 4.0 4.4  CL 99* 105 102 103  CO2 25 23 24 26   GLUCOSE 115* 101* 107* 114*  BUN 8 5* 5* 7  CREATININE 1.30* 1.03 1.08 0.95  CALCIUM 8.3* 8.0* 7.7* 8.3*  MG  --   --    --  2.0   GFR: Estimated Creatinine Clearance: 149.1 mL/min (by C-G formula based on SCr of 0.95 mg/dL). Liver Function Tests: Recent Labs  Lab 12/20/16 2316  AST 28  ALT 35  ALKPHOS 103  BILITOT 1.0  PROT 7.4  ALBUMIN 2.8*   No results for input(s): LIPASE, AMYLASE in the last 168 hours. No results for input(s): AMMONIA in the last 168 hours. Coagulation Profile: No results for input(s): INR, PROTIME in the last 168 hours. Cardiac Enzymes: No results for input(s): CKTOTAL, CKMB, CKMBINDEX, TROPONINI in the last 168 hours. BNP (last 3 results) No results for input(s): PROBNP in the last 8760 hours. HbA1C: No results for input(s): HGBA1C in the last 72 hours. CBG: No results for input(s): GLUCAP in the last 168 hours. Lipid Profile: No results for input(s): CHOL, HDL, LDLCALC, TRIG, CHOLHDL, LDLDIRECT in the last 72 hours. Thyroid Function Tests: No results for input(s): TSH, T4TOTAL, FREET4, T3FREE, THYROIDAB in the last 72 hours. Anemia Panel: No results for input(s): VITAMINB12, FOLATE, FERRITIN, TIBC, IRON, RETICCTPCT in the last 72 hours. Urine analysis:    Component Value Date/Time   COLORURINE YELLOW 09/22/2016 1701   APPEARANCEUR CLEAR 09/22/2016 1701   LABSPEC 1.015 09/22/2016 1701   PHURINE 6.0 09/22/2016 1701   GLUCOSEU NEGATIVE 09/22/2016 1701   HGBUR NEGATIVE 09/22/2016 1701   BILIRUBINUR NEGATIVE 09/22/2016 1701   KETONESUR TRACE (A) 09/22/2016 1701   PROTEINUR NEGATIVE 09/22/2016 1701   NITRITE NEGATIVE 09/22/2016 1701   LEUKOCYTESUR NEGATIVE 09/22/2016 1701   Sepsis Labs: Invalid input(s): PROCALCITONIN, LACTICIDVEN  Recent Results (from the past 240 hour(s))  Blood culture (routine x 2)     Status: Abnormal   Collection Time: 12/21/16  7:50 AM  Result Value Ref Range Status   Specimen Description BLOOD RIGHT ANTECUBITAL  Final   Special Requests   Final    BOTTLES DRAWN AEROBIC AND ANAEROBIC Blood Culture adequate volume   Culture  Setup Time    Final    GRAM POSITIVE COCCI IN CHAINS AEROBIC BOTTLE ONLY CRITICAL RESULT CALLED TO, READ BACK BY AND VERIFIED WITH: Alex Gardener PHARMD, AT [phone] 12/22/16 BY D. VANHOOK    Culture STREPTOCOCCUS PYOGENES (A)  Final   Report Status 12/24/2016 FINAL  Final   Organism ID, Bacteria STREPTOCOCCUS PYOGENES  Final      Susceptibility   Streptococcus pyogenes - MIC*    PENICILLIN <=0.06 SENSITIVE Sensitive     CEFTRIAXONE <=0.12 INTERMEDIATE Intermediate     ERYTHROMYCIN 2 RESISTANT Resistant     LEVOFLOXACIN 0.5 SENSITIVE Sensitive     VANCOMYCIN 0.5 SENSITIVE Sensitive     * STREPTOCOCCUS PYOGENES  Blood Culture ID Panel (Reflexed)     Status: Abnormal   Collection Time: 12/21/16  7:50 AM  Result Value Ref Range Status   Enterococcus species NOT DETECTED NOT DETECTED Final   Listeria monocytogenes NOT DETECTED NOT DETECTED Final   Staphylococcus species NOT DETECTED NOT DETECTED Final   Staphylococcus aureus NOT DETECTED NOT DETECTED Final   Streptococcus species DETECTED (A) NOT DETECTED Final    Comment: CRITICAL  RESULT CALLED TO, READ BACK BY AND VERIFIED WITH: Alex GardenerK. WEIGLE PHARMD, AT [phone] 12/22/16 BY D. VANHOOK    Streptococcus agalactiae NOT DETECTED NOT DETECTED Final   Streptococcus pneumoniae NOT DETECTED NOT DETECTED Final   Streptococcus pyogenes DETECTED (A) NOT DETECTED Final    Comment: CRITICAL RESULT CALLED TO, READ BACK BY AND VERIFIED WITH: Alex GardenerK. WEIGLE PHARMD, AT [phone] 12/22/16 BY D. VANHOOK    Acinetobacter baumannii NOT DETECTED NOT DETECTED Final   Enterobacteriaceae species NOT DETECTED NOT DETECTED Final   Enterobacter cloacae complex NOT DETECTED NOT DETECTED Final   Escherichia coli NOT DETECTED NOT DETECTED Final   Klebsiella oxytoca NOT DETECTED NOT DETECTED Final   Klebsiella pneumoniae NOT DETECTED NOT DETECTED Final   Proteus species NOT DETECTED NOT DETECTED Final   Serratia marcescens NOT DETECTED NOT DETECTED Final   Haemophilus influenzae NOT DETECTED NOT  DETECTED Final   Neisseria meningitidis NOT DETECTED NOT DETECTED Final   Pseudomonas aeruginosa NOT DETECTED NOT DETECTED Final   Candida albicans NOT DETECTED NOT DETECTED Final   Candida glabrata NOT DETECTED NOT DETECTED Final   Candida krusei NOT DETECTED NOT DETECTED Final   Candida parapsilosis NOT DETECTED NOT DETECTED Final   Candida tropicalis NOT DETECTED NOT DETECTED Final  Blood culture (routine x 2)     Status: None (Preliminary result)   Collection Time: 12/21/16  7:59 AM  Result Value Ref Range Status   Specimen Description BLOOD RIGHT WRIST  Final   Special Requests IN PEDIATRIC BOTTLE Blood Culture adequate volume  Final   Culture NO GROWTH 4 DAYS  Final   Report Status PENDING  Incomplete  Respiratory Panel by PCR     Status: None   Collection Time: 12/21/16  2:47 PM  Result Value Ref Range Status   Adenovirus NOT DETECTED NOT DETECTED Final   Coronavirus 229E NOT DETECTED NOT DETECTED Final   Coronavirus HKU1 NOT DETECTED NOT DETECTED Final   Coronavirus NL63 NOT DETECTED NOT DETECTED Final   Coronavirus OC43 NOT DETECTED NOT DETECTED Final   Metapneumovirus NOT DETECTED NOT DETECTED Final   Rhinovirus / Enterovirus NOT DETECTED NOT DETECTED Final   Influenza A NOT DETECTED NOT DETECTED Final   Influenza B NOT DETECTED NOT DETECTED Final   Parainfluenza Virus 1 NOT DETECTED NOT DETECTED Final   Parainfluenza Virus 2 NOT DETECTED NOT DETECTED Final   Parainfluenza Virus 3 NOT DETECTED NOT DETECTED Final   Parainfluenza Virus 4 NOT DETECTED NOT DETECTED Final   Respiratory Syncytial Virus NOT DETECTED NOT DETECTED Final   Bordetella pertussis NOT DETECTED NOT DETECTED Final   Chlamydophila pneumoniae NOT DETECTED NOT DETECTED Final   Mycoplasma pneumoniae NOT DETECTED NOT DETECTED Final      Radiology Studies: Dg Chest 2 View  Result Date: 12/24/2016 CLINICAL DATA:  Pneumonia and fever. Dry cough that began productive today. EXAM: CHEST  2 VIEW COMPARISON:   CT 3 days ago. FINDINGS: Small to moderate left pleural effusion that is increased from 3 days ago. Left lower lobe pneumonia seen previously is obscured. Normal heart size. No visible cavitation. IMPRESSION: Small to moderate left pleural effusion that is progressed from 3 days ago. Electronically Signed   By: Marnee SpringJonathon  Watts M.D.   On: 12/24/2016 20:03     Scheduled Meds: . bictegravir-emtricitabine-tenofovir AF  1 tablet Oral Daily  . enoxaparin (LOVENOX) injection  40 mg Subcutaneous Q24H   Continuous Infusions: . pencillin G potassium IV 4 Million Units (12/26/16 0756)  Albertine Grates, MD, PhD [facility] Pager [phone]  If 7PM-7AM, please contact night-coverage [email or web address] Password [email or web address] 12/26/2016, 8:05 AM

## 2016-12-27 ENCOUNTER — Ambulatory Visit: Payer: Self-pay | Admitting: Infectious Diseases

## 2016-12-27 DIAGNOSIS — Z9889 Other specified postprocedural states: Secondary | ICD-10-CM

## 2016-12-27 DIAGNOSIS — R61 Generalized hyperhidrosis: Secondary | ICD-10-CM

## 2016-12-27 LAB — BASIC METABOLIC PANEL
Anion gap: 5 (ref 5–15)
BUN: 10 mg/dL (ref 6–20)
CHLORIDE: 103 mmol/L (ref 101–111)
CO2: 26 mmol/L (ref 22–32)
CREATININE: 0.95 mg/dL (ref 0.61–1.24)
Calcium: 8.5 mg/dL — ABNORMAL LOW (ref 8.9–10.3)
Glucose, Bld: 94 mg/dL (ref 65–99)
POTASSIUM: 5.4 mmol/L — AB (ref 3.5–5.1)
SODIUM: 134 mmol/L — AB (ref 135–145)

## 2016-12-27 LAB — CBC
HCT: 36.9 % — ABNORMAL LOW (ref 39.0–52.0)
HEMOGLOBIN: 12.4 g/dL — AB (ref 13.0–17.0)
MCH: 31.6 pg (ref 26.0–34.0)
MCHC: 33.6 g/dL (ref 30.0–36.0)
MCV: 94.1 fL (ref 78.0–100.0)
PLATELETS: 529 10*3/uL — AB (ref 150–400)
RBC: 3.92 MIL/uL — AB (ref 4.22–5.81)
RDW: 13.9 % (ref 11.5–15.5)
WBC: 6.6 10*3/uL (ref 4.0–10.5)

## 2016-12-27 LAB — PH, BODY FLUID: pH, Body Fluid: 7.7

## 2016-12-27 NOTE — Progress Notes (Signed)
[facility]    Date of Admission:  12/21/2016   Total days of antibiotics 7        Day 6 penicillin IV 24MU           ID: Eric [name] is a 26 y.o. male with well cotnrolled HIV disease, CD 4 count of 470/VL 40 in Nov on biktarvy admitted with pneumonia, strep pyogenes bacteremia Active Problems:   HIV (human immunodeficiency virus infection) (HCC)   Multifocal pneumonia   AKI (acute kidney injury) (HCC)   Hypotension   Sepsis due to Streptococcus, group A (HCC)   Bacteremia    Subjective: Afebrile, still has productive cough but sputum is clear. He underwent thoracentesis with 120mL removed yesterday  ROS: 12 point ros is negative except for cough and nightsweats Medications:  . bictegravir-emtricitabine-tenofovir AF  1 tablet Oral Daily  . [START ON 12/28/2016] enoxaparin (LOVENOX) injection  40 mg Subcutaneous Q24H    Objective: Vital signs in last 24 hours: Temp:  [98.1 F (36.7 C)-98.9 F (37.2 C)] 98.1 F (36.7 C) (11/19 0911) Pulse Rate:  [75-97] 89 (11/19 0911) Resp:  [17-20] 20 (11/19 0911) BP: (106-130)/(55-78) 127/71 (11/19 0911) SpO2:  [96 %-100 %] 100 % (11/19 0911) Physical Exam  Constitutional: He is oriented to person, place, and time. He appears well-developed and well-nourished. No distress.  HENT:  Mouth/Throat: Oropharynx is clear and moist. No oropharyngeal exudate.  Cardiovascular: Normal rate, regular rhythm and normal heart sounds. Exam reveals no gallop and no friction rub.  No murmur heard.  Pulmonary/Chest: Effort normal and breath sounds normal. No respiratory distress. He has no wheezes.  Abdominal: Soft. Bowel sounds are normal. He exhibits no distension. There is no tenderness.  Lymphadenopathy:  He has no cervical adenopathy.  Neurological: He is alert and oriented to person, place, and time.  Skin: Skin is warm and dry. No rash noted. No erythema.  Psychiatric: He has a normal mood and affect. His  behavior is normal.    Lab Results Recent Labs    12/26/16 0450 12/27/16 0354  WBC 7.8 6.6  HGB 12.5* 12.4*  HCT 36.3* 36.9*  NA 135 134*  K 4.4 5.4*  CL 103 103  CO2 26 26  BUN 7 10  CREATININE 0.95 0.95    Microbiology: 11/13 blood cx strep pyogenes 11/17 blood cx ngtd Studies/Results: Dg Chest 1 View  Result Date: 12/26/2016 CLINICAL DATA:  Post left thoracentesis EXAM: CHEST 1 VIEW COMPARISON:  12/26/2016 FINDINGS: Moderate left pleural effusion and small right pleural effusion noted, unchanged. No pneumothorax following thoracentesis. Left lower lobe atelectasis or infiltrate with minimal right base atelectasis. Findings similar to prior study. IMPRESSION: No significant change from prior study.  No pneumothorax. Electronically Signed   By: Charlett NoseKevin  Dover M.D.   On: 12/26/2016 12:19   Dg Chest 2 View  Result Date: 12/26/2016 CLINICAL DATA:  Pleural effusion, left EXAM: CHEST - 2 VIEW COMPARISON:  12/24/2016 FINDINGS: Moderately large left pleural effusions stable. Consolidation/ atelectasis at the left lung base stable. Trace right pleural effusion with blunting of the posterior costophrenic angle. Right lung otherwise clear. Heart size and mediastinal contours are within normal limits. No pneumothorax. Visualized bones unremarkable. IMPRESSION: Bilateral pleural effusions, left greater than right. Little change from previous exam. Electronically Signed   By: [name] M.D.   On: 12/26/2016 10:13   Dg Chest Left Decubitus  Result Date: 12/26/2016 CLINICAL DATA:  Pleural effusion, left EXAM: CHEST -  LEFT DECUBITUS COMPARISON:  12/24/2016 FINDINGS: Large free fluid component of the left pleural effusion. Visualized portions of right lung clear. Heart size normal. Visualized bones unremarkable. IMPRESSION: Large free-flowing component of left pleural effusion. Electronically Signed   By: [name] M.D.   On: 12/26/2016 10:12   [place] Thoracentesis Asp Pleural Space W/img  Guide  Result Date: 12/26/2016 INDICATION: Patient with history of HIV, pneumonia, small left pleural effusion. Request made for diagnostic and therapeutic left thoracentesis EXAM: ULTRASOUND GUIDED DIAGNOSTIC AND THERAPEUTIC LEFT THORACENTESIS MEDICATIONS: None. COMPLICATIONS: None immediate. PROCEDURE: An ultrasound guided thoracentesis was thoroughly discussed with the patient and questions answered. The benefits, risks, alternatives and complications were also discussed. The patient understands and wishes to proceed with the procedure. Written consent was obtained. Ultrasound was performed to localize and mark an adequate pocket of fluid in the left chest. The area was then prepped and draped in the normal sterile fashion. 1% Lidocaine was used for local anesthesia. Under ultrasound guidance a Safe-T-Centesis catheter was introduced. Thoracentesis was performed. The catheter was removed and a dressing applied. FINDINGS: A total of approximately 120 cc of hazy, blood-tinged fluid was removed. Samples were sent to the laboratory as requested by the clinical team. IMPRESSION: Successful ultrasound guided diagnostic and therapeutic left thoracentesis yielding 120 cc of pleural fluid. Follow-up chest radiograph shows no pneumothorax. Read by: Jeananne RamaKevin Allred, PA-C Electronically Signed   By: [name] M.D.   On: 12/26/2016 12:14     Assessment/Plan: 26yo M with well controlled hiv disease, admitted for multifocal pneumonia with empyema c/b strep pyogenes bacteremia  - continue on IV penicillin while he is hospitalized - we can discharge him on 8 additional days of cephalexin 500mg  PO QID to finish out a total of 14 d of abtx  HIV disease - Continue on biktarvy to take daily  nightsweats - likely associated with pneumonia, likely to resolve over the next week  Will follow up in the ID clinic in 2-4 wk  Tamanna Whitson, [facility] Cell: [phone] Pager:  [phone]  12/27/2016, 12:20 PM

## 2016-12-27 NOTE — Progress Notes (Addendum)
PROGRESS NOTE  Eric [name] ZOX:[id number] DOB: 12/11/1990 DOA: 12/21/2016   PCP: Patient, No Pcp Per   LOS: 5 days   Brief Narrative / Interim history: 26 y.o. male with medical history significant of HIV, depression, [facility] with flulike symptoms and shortness of breath that started approximately 6-7 days prior to admission and were associated with generalized body aches and chills, productive cough. Patient's fever curve initially improving, however 11/15 p.m. spiked again, ID consulted on 11/16.  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   Objective: Vitals:   12/26/16 2054 12/27/16 0150 12/27/16 0543 12/27/16 0911  BP: 130/78 119/66 (!) 106/55 127/71  Pulse: 95 75 76 89  Resp: 18 18 18 20   Temp: 98.7 F (37.1 C) 98.3 F (36.8 C) 98.9 F (37.2 C) 98.1 F (36.7 C)    TempSrc: Oral Oral Oral Oral  SpO2: 96% 99% 100% 100%  Weight:      Height:        Intake/Output Summary (Last 24 hours) at 12/27/2016 1129 Last data filed at 12/27/2016 0408 Gross per 24 hour  Intake -  Output 1700 ml  Net -1700 ml   Filed Weights   12/20/16 2317  Weight: 99.8 kg (220 lb)    Physical Exam  Constitutional: Appears well-developed and well-nourished. No distress.  CVS: RRR, S1/S2 +, no murmurs, no gallops, no carotid bruit.  Pulmonary: Effort and breath sounds normal, diminished breath sounds on the left side  Abdominal: Soft. BS +,  no distension, tenderness, rebound or guarding.  Musculoskeletal: Normal range of motion. No edema and no tenderness.  Neuro: Alert. Normal reflexes, muscle tone coordination. No cranial nerve deficit. Psychiatric: Normal mood and affect. Behavior, judgment, thought content normal.   Data Reviewed: I have independently reviewed following labs and imaging studies  CBC: Recent Labs  Lab 12/20/16 2316 12/22/16 0319 12/23/16 0319 12/26/16 0450 12/27/16 0354  WBC 12.8* 9.5 7.8 7.8 6.6  NEUTROABS 10.1*  --   --   --   --   HGB 13.6 11.9* 11.8* 12.5* 12.4*  HCT 39.0 33.9* 34.2* 36.3* 36.9*  MCV 90.7 90.2 91.0 92.8 94.1  PLT 226 238 315 485* 529*   Basic Metabolic Panel: Recent Labs  Lab 12/20/16 2316 12/22/16 0319 12/23/16 0319 12/26/16 0450 12/27/16 0354  NA 132* 135 133* 135 134*  K 3.6 3.7 4.0 4.4 5.4*  CL 99* 105 102 103 103  CO2 25 23 24 26 26   GLUCOSE 115* 101* 107* 114* 94  BUN 8 5* 5* 7 10  CREATININE 1.30* 1.03 1.08 0.95 0.95  CALCIUM 8.3* 8.0* 7.7* 8.3* 8.5*  MG  --   --   --  2.0  --    Liver Function Tests: Recent Labs  Lab 12/20/16 2316  AST 28  ALT 35  ALKPHOS 103  BILITOT 1.0  PROT 7.4  ALBUMIN 2.8*   Urine analysis:    Component Value Date/Time   COLORURINE YELLOW 09/22/2016 1701   APPEARANCEUR CLEAR 09/22/2016 1701   LABSPEC 1.015 09/22/2016 1701   PHURINE 6.0 09/22/2016 1701    GLUCOSEU NEGATIVE 09/22/2016 1701   HGBUR NEGATIVE 09/22/2016 1701   BILIRUBINUR NEGATIVE 09/22/2016 1701   KETONESUR TRACE (A) 09/22/2016 1701   PROTEINUR NEGATIVE 09/22/2016 1701   NITRITE NEGATIVE 09/22/2016 1701   LEUKOCYTESUR NEGATIVE 09/22/2016 1701   Sepsis Labs:  Recent Results (from the past 240 hour(s))  Blood culture (routine x 2)     Status: Abnormal   Collection Time: 12/21/16  7:50 AM  Result Value Ref Range Status   Specimen Description BLOOD RIGHT ANTECUBITAL  Final   Special Requests   Final    BOTTLES DRAWN AEROBIC AND ANAEROBIC Blood Culture adequate volume   Culture  Setup Time   Final    GRAM POSITIVE COCCI IN CHAINS AEROBIC BOTTLE ONLY CRITICAL RESULT CALLED TO, READ BACK BY AND VERIFIED WITH: Alex Gardener PHARMD, AT 1610 12/22/16 BY D. VANHOOK    Culture STREPTOCOCCUS PYOGENES (A)  Final   Report Status 12/24/2016 FINAL  Final   Organism ID, Bacteria STREPTOCOCCUS PYOGENES  Final      Susceptibility   Streptococcus pyogenes - MIC*    PENICILLIN <=0.06 SENSITIVE Sensitive     CEFTRIAXONE <=0.12 INTERMEDIATE Intermediate     ERYTHROMYCIN 2 RESISTANT Resistant     LEVOFLOXACIN 0.5 SENSITIVE Sensitive     VANCOMYCIN 0.5 SENSITIVE Sensitive     * STREPTOCOCCUS PYOGENES  Blood Culture ID Panel (Reflexed)     Status: Abnormal   Collection Time: 12/21/16  7:50 AM  Result Value Ref Range Status   Enterococcus species NOT DETECTED NOT DETECTED Final   Listeria monocytogenes NOT DETECTED NOT DETECTED Final   Staphylococcus species NOT DETECTED NOT DETECTED Final   Staphylococcus aureus NOT DETECTED NOT DETECTED Final   Streptococcus species DETECTED (A) NOT DETECTED Final    Comment: CRITICAL RESULT CALLED TO, READ BACK BY AND VERIFIED WITH: Alex Gardener PHARMD, AT 9604 12/22/16 BY D. VANHOOK    Streptococcus agalactiae NOT DETECTED NOT DETECTED Final   Streptococcus pneumoniae NOT DETECTED NOT DETECTED Final   Streptococcus pyogenes DETECTED (A) NOT DETECTED Final     Comment: CRITICAL RESULT CALLED TO, READ BACK BY AND VERIFIED WITH: Alex Gardener PHARMD, AT 5409 12/22/16 BY D. VANHOOK    Acinetobacter baumannii NOT DETECTED NOT DETECTED Final   Enterobacteriaceae species NOT DETECTED NOT DETECTED Final   Enterobacter cloacae complex NOT DETECTED NOT DETECTED Final   Escherichia coli NOT DETECTED NOT DETECTED Final   Klebsiella oxytoca NOT DETECTED NOT DETECTED Final   Klebsiella pneumoniae NOT DETECTED NOT DETECTED Final   Proteus species NOT DETECTED NOT DETECTED Final   Serratia marcescens NOT DETECTED NOT DETECTED Final   Haemophilus influenzae NOT DETECTED NOT DETECTED Final   Neisseria meningitidis NOT DETECTED NOT DETECTED Final   Pseudomonas  aeruginosa NOT DETECTED NOT DETECTED Final   Candida albicans NOT DETECTED NOT DETECTED Final   Candida glabrata NOT DETECTED NOT DETECTED Final   Candida krusei NOT DETECTED NOT DETECTED Final   Candida parapsilosis NOT DETECTED NOT DETECTED Final   Candida tropicalis NOT DETECTED NOT DETECTED Final  Blood culture (routine x 2)     Status: None   Collection Time: 12/21/16  7:59 AM  Result Value Ref Range Status   Specimen Description BLOOD RIGHT WRIST  Final   Special Requests IN PEDIATRIC BOTTLE Blood Culture adequate volume  Final   Culture NO GROWTH 5 DAYS  Final   Report Status 12/26/2016 FINAL  Final  Respiratory Panel by PCR     Status: None   Collection Time: 12/21/16  2:47 PM  Result Value Ref Range Status   Adenovirus NOT DETECTED NOT DETECTED Final   Coronavirus 229E NOT DETECTED NOT DETECTED Final   Coronavirus HKU1 NOT DETECTED NOT DETECTED Final   Coronavirus NL63 NOT DETECTED NOT DETECTED Final   Coronavirus OC43 NOT DETECTED NOT DETECTED Final   Metapneumovirus NOT DETECTED NOT DETECTED Final   Rhinovirus / Enterovirus NOT DETECTED NOT DETECTED Final   Influenza A NOT DETECTED NOT DETECTED Final   Influenza B NOT DETECTED NOT DETECTED Final   Parainfluenza Virus 1 NOT DETECTED NOT  DETECTED Final   Parainfluenza Virus 2 NOT DETECTED NOT DETECTED Final   Parainfluenza Virus 3 NOT DETECTED NOT DETECTED Final   Parainfluenza Virus 4 NOT DETECTED NOT DETECTED Final   Respiratory Syncytial Virus NOT DETECTED NOT DETECTED Final   Bordetella pertussis NOT DETECTED NOT DETECTED Final   Chlamydophila pneumoniae NOT DETECTED NOT DETECTED Final   Mycoplasma pneumoniae NOT DETECTED NOT DETECTED Final  Culture, blood (Routine X 2) w Reflex to ID Panel     Status: None (Preliminary result)   Collection Time: 12/25/16 11:20 AM  Result Value Ref Range Status   Specimen Description BLOOD LEFT HAND  Final   Special Requests IN PEDIATRIC BOTTLE Blood Culture adequate volume  Final   Culture NO GROWTH 1 DAY  Final   Report Status PENDING  Incomplete  Culture, blood (Routine X 2) w Reflex to ID Panel     Status: None (Preliminary result)   Collection Time: 12/25/16 11:25 AM  Result Value Ref Range Status   Specimen Description BLOOD LEFT HAND  Final   Special Requests IN PEDIATRIC BOTTLE Blood Culture adequate volume  Final   Culture NO GROWTH 1 DAY  Final   Report Status PENDING  Incomplete  Gram stain     Status: None   Collection Time: 12/26/16 11:48 AM  Result Value Ref Range Status   Specimen Description PLEURAL LEFT  Final   Special Requests NONE  Final   Gram Stain   Final    MODERATE WBC PRESENT,BOTH PMN AND MONONUCLEAR NO ORGANISMS SEEN    Report Status 12/26/2016 FINAL  Final    Radiology Studies: Dg Chest 1 View  Result Date: 12/26/2016 CLINICAL DATA:  Post left thoracentesis EXAM: CHEST 1 VIEW COMPARISON:  12/26/2016 FINDINGS: Moderate left pleural effusion and small right pleural effusion noted, unchanged. No pneumothorax following thoracentesis. Left lower lobe atelectasis or infiltrate with minimal right base atelectasis. Findings similar to prior study. IMPRESSION: No significant change from prior study.  No pneumothorax. Electronically Signed   By: Charlett NoseKevin  Dover  M.D.   On: 12/26/2016 12:19   Dg Chest 2 View  Result Date: 12/26/2016 CLINICAL DATA:  Pleural effusion, left EXAM: CHEST - 2 VIEW COMPARISON:  12/24/2016 FINDINGS: Moderately large left pleural effusions stable. Consolidation/ atelectasis at the left lung base stable. Trace right pleural effusion with blunting of the posterior costophrenic angle. Right lung otherwise clear. Heart size and mediastinal contours are within normal limits. No pneumothorax. Visualized bones unremarkable. IMPRESSION: Bilateral pleural effusions, left greater than right. Little change from previous exam. Electronically Signed   By: Corlis Leak M.D.   On: 12/26/2016 10:13   Dg Chest Left Decubitus  Result Date: 12/26/2016 CLINICAL DATA:  Pleural effusion, left EXAM: CHEST - LEFT DECUBITUS COMPARISON:  12/24/2016 FINDINGS: Large free fluid component of the left pleural effusion. Visualized portions of right lung clear. Heart size normal. Visualized bones unremarkable. IMPRESSION: Large free-flowing component of left pleural effusion. Electronically Signed   By: Corlis Leak M.D.   On: 12/26/2016 10:12   US Thoracentesis Asp Pleural Space W/img Guide  Result Date: 12/26/2016 INDICATION: Patient with history of HIV, pneumonia, small left pleural effusion. Request made for diagnostic and therapeutic left thoracentesis EXAM: ULTRASOUND GUIDED DIAGNOSTIC AND THERAPEUTIC LEFT THORACENTESIS MEDICATIONS: None. COMPLICATIONS: None immediate. PROCEDURE: An ultrasound guided thoracentesis was thoroughly discussed with the patient and questions answered. The benefits, risks, alternatives and complications were also discussed. The patient understands and wishes to proceed with the procedure. Written consent was obtained. Ultrasound was performed to localize and mark an adequate pocket of fluid in the left chest. The area was then prepped and draped in the normal sterile fashion. 1% Lidocaine was used for local anesthesia. Under ultrasound  guidance a Safe-T-Centesis catheter was introduced. Thoracentesis was performed. The catheter was removed and a dressing applied. FINDINGS: A total of approximately 120 cc of hazy, blood-tinged fluid was removed. Samples were sent to the laboratory as requested by the clinical team. IMPRESSION: Successful ultrasound guided diagnostic and therapeutic left thoracentesis yielding 120 cc of pleural fluid. Follow-up chest radiograph shows no pneumothorax. Read by: Jeananne Rama, PA-C Electronically Signed   By: Corlis Leak M.D.   On: 12/26/2016 12:14   Scheduled Meds: . bictegravir-emtricitabine-tenofovir AF  1 tablet Oral Daily  . [START ON 12/28/2016] enoxaparin (LOVENOX) injection  40 mg Subcutaneous Q24H   Continuous Infusions: . pencillin G potassium IV 4 Million Units (12/27/16 6045)   Debbora Presto, MD  [facility] Pager [phone]  If 7PM-7AM, please contact night-coverage [email or web address] Password [email or web address]  12/27/2016, 11:29 AM

## 2016-12-27 NOTE — Care Management Note (Signed)
Case Management Note  Patient Details  Name: Eric [name] Awe MRN: [id number] Date of Birth: 08/07/1990  Subjective/Objective:                    Action/Plan: Plan is for home when medically ready. CM following for d/c needs.   Expected Discharge Date:                  Expected Discharge Plan:  Home/Self Care  In-House Referral:     Discharge planning Services  CM Consult, Medication Assistance, Indigent Health Clinic  Post Acute Care Choice:    Choice offered to:     DME Arranged:    DME Agency:     HH Arranged:    HH Agency:     Status of Service:  In process, will continue to follow  If discussed at Long Length of Stay Meetings, dates discussed:    Additional Comments:  Kermit BaloKelli F Johnthomas Lader, RN 12/27/2016, 3:11 PM

## 2016-12-28 DIAGNOSIS — B954 Other streptococcus as the cause of diseases classified elsewhere: Secondary | ICD-10-CM

## 2016-12-28 DIAGNOSIS — J154 Pneumonia due to other streptococci: Secondary | ICD-10-CM

## 2016-12-28 DIAGNOSIS — Z9889 Other specified postprocedural states: Secondary | ICD-10-CM

## 2016-12-28 DIAGNOSIS — J869 Pyothorax without fistula: Secondary | ICD-10-CM

## 2016-12-28 LAB — BASIC METABOLIC PANEL
ANION GAP: 8 (ref 5–15)
BUN: 12 mg/dL (ref 6–20)
CALCIUM: 8.6 mg/dL — AB (ref 8.9–10.3)
CO2: 25 mmol/L (ref 22–32)
Chloride: 101 mmol/L (ref 101–111)
Creatinine, Ser: 0.97 mg/dL (ref 0.61–1.24)
GLUCOSE: 109 mg/dL — AB (ref 65–99)
POTASSIUM: 4.5 mmol/L (ref 3.5–5.1)
SODIUM: 134 mmol/L — AB (ref 135–145)

## 2016-12-28 LAB — CBC
HEMATOCRIT: 36.9 % — AB (ref 39.0–52.0)
Hemoglobin: 12.6 g/dL — ABNORMAL LOW (ref 13.0–17.0)
MCH: 32.3 pg (ref 26.0–34.0)
MCHC: 34.1 g/dL (ref 30.0–36.0)
MCV: 94.6 fL (ref 78.0–100.0)
Platelets: 540 10*3/uL — ABNORMAL HIGH (ref 150–400)
RBC: 3.9 MIL/uL — AB (ref 4.22–5.81)
RDW: 13.6 % (ref 11.5–15.5)
WBC: 5.8 10*3/uL (ref 4.0–10.5)

## 2016-12-28 MED ORDER — AMOXICILLIN 500 MG PO CAPS: 500.0000 mg | ORAL_CAPSULE | Freq: Three times a day (TID) | ORAL | Status: DC

## 2016-12-28 MED ORDER — HYDROCODONE-ACETAMINOPHEN 5-325 MG PO TABS: 1.0000 | ORAL_TABLET | Freq: Four times a day (QID) | ORAL | 0 refills | Status: DC | PRN

## 2016-12-28 MED ORDER — AMOXICILLIN 500 MG PO CAPS: 500.0000 mg | ORAL_CAPSULE | Freq: Three times a day (TID) | ORAL | 0 refills | Status: DC

## 2016-12-28 NOTE — Progress Notes (Signed)
    [facility]    Date of Admission:  12/21/2016   Total days of antibiotics 7        Day 7 penicillin IV 24MU           ID: Eric [name] is a 26 y.o. male with well cotnrolled HIV disease, CD 4 count of 470/VL 40 in Nov on biktarvy admitted with pneumonia, strep pyogenes bacteremia Active Problems:   HIV (human immunodeficiency virus infection) (HCC)   Multifocal pneumonia   AKI (acute kidney injury) (HCC)   Hypotension   Sepsis due to Streptococcus, group A (HCC)   Bacteremia    Subjective: Afebrile, feeling better. Looking forward to going home  ROS: 12 point ros is negative except for dry cough Medications:  . amoxicillin  500 mg Oral Q8H  . bictegravir-emtricitabine-tenofovir AF  1 tablet Oral Daily  . enoxaparin (LOVENOX) injection  40 mg Subcutaneous Q24H    Objective: Vital signs in last 24 hours: Temp:  [97.6 F (36.4 C)-98.6 F (37 C)] 98.4 F (36.9 C) (11/20 0944) Pulse Rate:  [80-102] 91 (11/20 0944) Resp:  [17-20] 17 (11/20 0944) BP: (110-136)/(66-80) 121/79 (11/20 0944) SpO2:  [96 %-100 %] 96 % (11/20 0944) Constitutional: He is oriented to person, place, and time. He appears well-developed and well-nourished. No distress.  HENT:  Mouth/Throat: Oropharynx is clear and moist. No oropharyngeal exudate.  Cardiovascular: Normal rate, regular rhythm and normal heart sounds. Exam reveals no gallop and no friction rub.  No murmur heard.  Pulmonary/Chest: Effort normal and breath sounds normal. No respiratory distress. He has no wheezes.  Skin: Skin is warm and dry. No rash noted. No erythema.  Psychiatric: He has a normal mood and affect. His behavior is normal.      Lab Results Recent Labs    12/27/16 0354 12/28/16 0449  WBC 6.6 5.8  HGB 12.4* 12.6*  HCT 36.9* 36.9*  NA 134* 134*  K 5.4* 4.5  CL 103 101  CO2 26 25  BUN 10 12  CREATININE 0.95 0.97    Microbiology: 11/13 blood cx strep pyogenes 11/17 blood cx  ngtd Studies/Results: No results found.   Assessment/Plan: 26yo M with well controlled hiv disease, admitted for multifocal pneumonia with empyema c/b strep pyogenes bacteremia  - we can discharge him on 7 additional days of amoxicillin 500mg  TID to finish out a total of 14 d of abtx  HIV disease - Continue on biktarvy to take daily  nightsweats - resolved  Will follow up in the ID clinic in 2-4 wk  Ivor Kishi, [facility] Cell: [phone] Pager: [phone]  12/28/2016, 12:40 PM

## 2016-12-28 NOTE — Discharge Instructions (Signed)
Blood Culture Test Why am I having this test? A blood culture test is performed to see if you have an infection in your blood (septicemia). Septicemia could be caused by bacteria, fungi, or viruses. Normally, blood is free of bacteria, fungi, and viruses. This test may be ordered if you have symptoms of septicemia. These symptoms may include fever, chills, nausea, and fatigue. What kind of sample is taken? At least two blood samples from two different veins are required for this test. The blood samples are usually collected by inserting a needle into a vein. This is done because:  There is a better chance of finding the infection with multiple samples.  Sometimes, despite disinfection of the skin where the blood is collected, you can grow a skin contaminant. This will result in a positive blood culture. This is called a false-positive. With multiple samples, there is a better chance of ruling out a false-positive.  How do I prepare for this test? It is preferred to have the blood samples performed before starting antibiotic medicine. Tell your health care provider if you are currently taking an antibiotic. If blood cultures are performed while you are on an antibiotic, the blood samples should be performed shortly before you take a dose of antibiotic. How are the results reported? Your test results will be reported as either positive or negative. It is your responsibility to obtain your test results. Ask the lab or department performing the test when and how you will get your results. A false-positive result can occur. A false-positive result is incorrect because it indicates a condition or finding is present when it is not. A false-negative result can occur. A false-negative result is incorrect because it indicates a condition or finding is not present when it is. What do the results mean? A positive blood test may mean that you have septicemia. Talk with your health care provider to discuss your  results, treatment options, and if necessary, the need for more tests. Talk with your health care provider if you have any questions about your results. Talk with your health care provider to discuss your results, treatment options, and if necessary, the need for more tests. Talk with your health care provider if you have any questions about your results. This information is not intended to replace advice given to you by your health care provider. Make sure you discuss any questions you have with your health care provider. Document Released: 02/18/2004 Document Revised: 09/29/2015 Document Reviewed: 07/02/2013 Elsevier Interactive Patient Education  2018 [employer]

## 2016-12-28 NOTE — Progress Notes (Signed)
Patient given discharge instructions.  All questions and concerns addressed. Patient left unit by wheelchair with all belongings.  

## 2016-12-28 NOTE — Discharge Summary (Signed)
Physician Discharge Summary  Eric [name] ZOX:[id number] DOB: 06/06/1990 DOA: 12/21/2016  PCP: Eric [name], No Pcp Per  Admit date: 12/21/2016 Discharge date: 12/28/2016  Recommendations for Outpatient Follow-up:  1. Pt will need to follow up with PCP in 2-3 weeks post discharge 2. Please obtain BMP to evaluate electrolytes and kidney function 3. Please also check CBC to evaluate Plt counts  4. Pt to complete treatment with Amoxicillin 500 mg TID for 7 more days on discharge (this will complete total of 14 days treatment with ABX) 5. Pt will have an appointment scheduled at ID clinic for follow up   Discharge Diagnoses:  Active Problems:   HIV (human immunodeficiency virus infection) (HCC)   Multifocal pneumonia   AKI (acute kidney injury) (HCC)   Hypotension   Sepsis due to Streptococcus, group A (HCC)   Bacteremia  Discharge Condition: Stable  Diet recommendation: Heart healthy diet discussed in details   History of present illness:  26 y.o.malewith medical history significant ofHIV, depression, [facility] flulike symptoms and shortness of breath that started approximately 6-7 days prior to admission and associated with generalized body aches and chills, productive cough. Eric [name]'s fever curve initially improving, however 11/15 p.m. spiked again, ID consulted on 11/16.  Assessment & Plan:  Multifocal pneumonia/pleural effusion / Streptococcus pyogenes bacteremia - CTA on presentation negative for PE, +Bilateral multifocal Bronchopneumonia with left pleural effusion - resp viral panel negative - no fevers in the past 48 hours  - repeat CXR on 11/18 with large left pleural effusion  - US guided thoracentesis done 11/19,  100 cc fluid removed and sent for analysis, final report pending  - repeat blood cultures with no growth to date - appreciate ID team following, plan to continue Amoxicillin for 7 more days post discharge   HIV - new  Dx as ov 09/22/16 - CD4 470, HIV count low at 20 - continue Biktarvy  Hyperkalemia - supplemented   AKI - suspect pre renal in etiology - resolved   Thrombocytosis - suspect reactive, outpatient follow up   Time spent: 60 minutes   DVT prophylaxis: Lovenox SQ Code Status: Full code Family Communication: pt and aunt at bedside  Disposition Plan: home   Consultants:   ID  Procedures:   [place] guided thoracentesis ordered on 11/18  Antimicrobials:  Ceftriaxone / Azithromycin 11/13 >> 11/14  Penicillin 11/14 >> changed to amoxicillin on discharge   Procedures/Studies: Dg Chest 1 View  Result Date: 12/26/2016 CLINICAL DATA:  Post left thoracentesis EXAM: CHEST 1 VIEW COMPARISON:  12/26/2016 FINDINGS: Moderate left pleural effusion and small right pleural effusion noted, unchanged. No pneumothorax following thoracentesis. Left lower lobe atelectasis or infiltrate with minimal right base atelectasis. Findings similar to prior study. IMPRESSION: No significant change from prior study.  No pneumothorax. Electronically Signed   By: Charlett NoseKevin  Dover M.D.   On: 12/26/2016 12:19   Dg Chest 2 View  Result Date: 12/26/2016 CLINICAL DATA:  Pleural effusion, left EXAM: CHEST - 2 VIEW COMPARISON:  12/24/2016 FINDINGS: Moderately large left pleural effusions stable. Consolidation/ atelectasis at the left lung base stable. Trace right pleural effusion with blunting of the posterior costophrenic angle. Right lung otherwise clear. Heart size and mediastinal contours are within normal limits. No pneumothorax. Visualized bones unremarkable. IMPRESSION: Bilateral pleural effusions, left greater than right. Little change from previous exam. Electronically Signed   By: [name] M.D.   On: 12/26/2016 10:13   Dg Chest 2 View  Result  Date: 12/24/2016 CLINICAL DATA:  Pneumonia and fever. Dry cough that began productive today. EXAM: CHEST  2 VIEW COMPARISON:  CT 3 days ago. FINDINGS: Small to  moderate left pleural effusion that is increased from 3 days ago. Left lower lobe pneumonia seen previously is obscured. Normal heart size. No visible cavitation. IMPRESSION: Small to moderate left pleural effusion that is progressed from 3 days ago. Electronically Signed   By: [name] M.D.   On: 12/24/2016 20:03   Dg Chest 2 View  Result Date: 12/20/2016 CLINICAL DATA:  Flu like symptoms for 1 week. General body aches and chills. Recent diagnosis of HIV. EXAM: CHEST  2 VIEW COMPARISON:  None. FINDINGS: Posterior and lower lobe airspace disease is evident on the lateral view. This is not well visualized in the PA view, with may be in the medial left lung. The upper lung fields are clear. Heart size normal. The visualized soft tissues and bony thorax are unremarkable. IMPRESSION: Left lower lobe pneumonia. Electronically Signed   By: [name] M.D.   On: 12/20/2016 10:26   Ct Angio Chest Pe W Or Wo Contrast  Result Date: 12/21/2016 CLINICAL DATA:  26 year old male diagnosed with HIV in August. Increasing shortness of Breath. Recently diagnosed with pneumonia and started on antibiotics. Abnormal D-dimer. EXAM: CT ANGIOGRAPHY CHEST WITH CONTRAST TECHNIQUE: Multidetector CT imaging of the chest was performed using the standard protocol during bolus administration of intravenous contrast. Multiplanar CT image reconstructions and MIPs were obtained to evaluate the vascular anatomy. CONTRAST:  ISOVUE-370 IOPAMIDOL (ISOVUE-370) INJECTION 76% COMPARISON:  Chest radiographs 0600 hours today and earlier. FINDINGS: Cardiovascular: Suboptimal contrast bolus timing in the pulmonary arterial tree. No focal filling defect identified in the pulmonary arteries to suggest acute pulmonary embolism. Borderline to mild cardiomegaly. No pericardial effusion. Grossly negative visible aorta. Mediastinum/Nodes: Thymic tissue in the superior mediastinum is increased over that expected for age. Similar mildly  increased mediastinal and bilateral hilar lymph nodes. Lungs/Pleura: Major airways are patent. Trace left pleural effusion partially tracking into the left major fissure. Patchy and confluent lower lobe pulmonary opacity, greater on the left -with maximal involvement of the left lower lobe medial and posterior basal segments. Superimposed mild peripheral peribronchial nodularity in the anterior right middle lobe. Subtle distal peribronchial ground-glass nodularity in the right upper lobe. Upper Abdomen: Visible liver, pancreas, and spleen appear grossly normal. Heterogeneity of the renal upper poles is incompletely visualized but might reflect multiple renal cysts. Negative visible bowel. Musculoskeletal: Negative. Review of the MIP images confirms the above findings. IMPRESSION: 1.  Negative for acute pulmonary embolus. 2. Bilateral multifocal Bronchopneumonia suspected, worst in the left lower lobe. Associated trace Left Pleural Effusion. 3. Nonspecific increased thymus, mediastinal and hilar lymph node tissue. Differential considerations include reactive lymphadenopathy related to #2 versus HIV related lymphoproliferation. 4. Partially visible renal heterogeneity, perhaps bilateral simple renal cysts. Electronically Signed   By: Odessa Fleming M.D.   On: 12/21/2016 09:19   Dg Chest Left Decubitus  Result Date: 12/26/2016 CLINICAL DATA:  Pleural effusion, left EXAM: CHEST - LEFT DECUBITUS COMPARISON:  12/24/2016 FINDINGS: Large free fluid component of the left pleural effusion. Visualized portions of right lung clear. Heart size normal. Visualized bones unremarkable. IMPRESSION: Large free-flowing component of left pleural effusion. Electronically Signed   By: Corlis Leak M.D.   On: 12/26/2016 10:12   Dg Chest Port 1 View  Result Date: 12/21/2016 CLINICAL DATA:  Acute onset of left lower chest pain and shortness of  breath. EXAM: PORTABLE CHEST 1 VIEW COMPARISON:  Chest radiograph performed 12/20/2016 FINDINGS:  The lungs are well-aerated and clear. There is no evidence of focal opacification, pleural effusion or pneumothorax. The cardiomediastinal silhouette is borderline normal in size. No acute osseous abnormalities are seen. IMPRESSION: No acute cardiopulmonary process seen. Electronically Signed   By: Roanna RaiderJeffery  Chang M.D.   On: 12/21/2016 06:29   [place] Thoracentesis Asp Pleural Space W/img Guide  Result Date: 12/26/2016 INDICATION: Eric [name] with history of HIV, pneumonia, small left pleural effusion. Request made for diagnostic and therapeutic left thoracentesis EXAM: ULTRASOUND GUIDED DIAGNOSTIC AND THERAPEUTIC LEFT THORACENTESIS MEDICATIONS: None. COMPLICATIONS: None immediate. PROCEDURE: An ultrasound guided thoracentesis was thoroughly discussed with the Eric [name] and questions answered. The benefits, risks, alternatives and complications were also discussed. The Eric [name] understands and wishes to proceed with the procedure. Written consent was obtained. Ultrasound was performed to localize and mark an adequate pocket of fluid in the left chest. The area was then prepped and draped in the normal sterile fashion. 1% Lidocaine was used for local anesthesia. Under ultrasound guidance a Safe-T-Centesis catheter was introduced. Thoracentesis was performed. The catheter was removed and a dressing applied. FINDINGS: A total of approximately 120 cc of hazy, blood-tinged fluid was removed. Samples were sent to the laboratory as requested by the clinical team. IMPRESSION: Successful ultrasound guided diagnostic and therapeutic left thoracentesis yielding 120 cc of pleural fluid. Follow-up chest radiograph shows no pneumothorax. Read by: Jeananne RamaKevin Allred, PA-C Electronically Signed   By: [name] M.D.   On: 12/26/2016 12:14    Discharge Exam: Vitals:   12/28/16 0552 12/28/16 0944  BP: 110/66 121/79  Pulse: 80 91  Resp: 20 17  Temp: 98.4 F (36.9 C) 98.4 F (36.9 C)  SpO2: 98% 96%   Vitals:   12/27/16 2112 12/28/16  0105 12/28/16 0552 12/28/16 0944  BP: 136/80 124/69 110/66 121/79  Pulse: (!) 102 82 80 91  Resp: 20 20 20 17   Temp: 98.4 F (36.9 C) 98.4 F (36.9 C) 98.4 F (36.9 C) 98.4 F (36.9 C)  TempSrc: Oral Oral Oral Oral  SpO2: 97% 100% 98% 96%  Weight:      Height:        General: Pt is alert, follows commands appropriately, not in acute distress Cardiovascular: Regular rate and rhythm, S1/S2 +, no murmurs, no rubs, no gallops Respiratory: Clear to auscultation bilaterally, no wheezing, no crackles, no rhonchi Abdominal: Soft, non tender, non distended, bowel sounds +, no guarding Extremities: no edema, no cyanosis, pulses palpable bilaterally DP and PT Neuro: Grossly nonfocal  Discharge Instructions  Discharge Instructions    Diet - low sodium heart healthy   Complete by:  As directed    Increase activity slowly   Complete by:  As directed      Allergies as of 12/28/2016   No Known Allergies     Medication List    STOP taking these medications   doxycycline 100 MG capsule Commonly known as:  VIBRAMYCIN     TAKE these medications   acetaminophen 325 MG tablet Commonly known as:  TYLENOL Take 650 mg every 6 (six) hours as needed by mouth for mild pain.   amoxicillin 500 MG capsule Commonly known as:  AMOXIL Take 1 capsule (500 mg total) by mouth every 8 (eight) hours.   bictegravir-emtricitabine-tenofovir AF 50-200-25 MG Tabs tablet Commonly known as:  BIKTARVY Take 1 tablet by mouth daily. Try to take at the same time each day with or  without food.   HYDROcodone-acetaminophen 5-325 MG tablet Commonly known as:  NORCO/VICODIN Take 1 tablet every 6 (six) hours as needed by mouth for moderate pain.   ibuprofen 200 MG tablet Commonly known as:  ADVIL,MOTRIN Take 200-800 mg every 6 (six) hours as needed by mouth for moderate pain.       Follow-up Information    [name] Follow up on 01/07/2017.   Why:  Your appointment time is 9:30 am.  Please arrive 15 min early and bring a picture ID and your current medications. Contact information: [address] [place], [place] [address]  [phone]       [facility] Follow up.   Why:  Please use this pharmacy to get assistance with the cost of medications. Contact information: [address] [place] [address] [phone]           The results of significant diagnostics from this hospitalization (including imaging, microbiology, ancillary and laboratory) are listed below for reference.     Microbiology: Recent Results (from the past 240 hour(s))  Blood culture (routine x 2)     Status: Abnormal (Preliminary result)   Collection Time: 12/21/16  7:50 AM  Result Value Ref Range Status   Specimen Description BLOOD RIGHT ANTECUBITAL  Final   Special Requests   Final    BOTTLES DRAWN AEROBIC AND ANAEROBIC Blood Culture adequate volume   Culture  Setup Time   Final    GRAM POSITIVE COCCI IN CHAINS AEROBIC BOTTLE ONLY CRITICAL RESULT CALLED TO, READ BACK BY AND VERIFIED WITH: Alex Gardener PHARMD, AT 6962 12/22/16 BY D. VANHOOK    Culture STREPTOCOCCUS PYOGENES (A)  Final   Report Status PENDING  Incomplete   Organism ID, Bacteria STREPTOCOCCUS PYOGENES  Final      Susceptibility   Streptococcus pyogenes - MIC*    PENICILLIN <=0.06 SENSITIVE Sensitive     CEFTRIAXONE Value in next row Sensitive      SENSITIVE<=0.12    ERYTHROMYCIN Value in next row Resistant      SENSITIVE<=0.12    LEVOFLOXACIN Value in next row Sensitive      SENSITIVE<=0.12    VANCOMYCIN Value in next row Sensitive      SENSITIVE<=0.12    * STREPTOCOCCUS PYOGENES  Blood Culture ID Panel (Reflexed)     Status: Abnormal   Collection Time: 12/21/16  7:50 AM  Result Value Ref Range Status   Enterococcus species NOT DETECTED NOT DETECTED Final   Listeria monocytogenes NOT DETECTED NOT DETECTED Final   Staphylococcus species NOT DETECTED NOT  DETECTED Final   Staphylococcus aureus NOT DETECTED NOT DETECTED Final   Streptococcus species DETECTED (A) NOT DETECTED Final    Comment: CRITICAL RESULT CALLED TO, READ BACK BY AND VERIFIED WITH: Alex Gardener PHARMD, AT 9528 12/22/16 BY D. VANHOOK    Streptococcus agalactiae NOT DETECTED NOT DETECTED Final   Streptococcus pneumoniae NOT DETECTED NOT DETECTED Final   Streptococcus pyogenes DETECTED (A) NOT DETECTED Final    Comment: CRITICAL RESULT CALLED TO, READ BACK BY AND VERIFIED WITH: Alex Gardener PHARMD, AT 4132 12/22/16 BY D. VANHOOK    Acinetobacter baumannii NOT DETECTED NOT DETECTED Final   Enterobacteriaceae species NOT DETECTED NOT DETECTED Final   Enterobacter cloacae complex NOT DETECTED NOT DETECTED Final   Escherichia coli NOT DETECTED NOT DETECTED Final   Klebsiella oxytoca NOT DETECTED NOT DETECTED Final   Klebsiella pneumoniae NOT DETECTED NOT DETECTED Final  Proteus species NOT DETECTED NOT DETECTED Final   Serratia marcescens NOT DETECTED NOT DETECTED Final   Haemophilus influenzae NOT DETECTED NOT DETECTED Final   Neisseria meningitidis NOT DETECTED NOT DETECTED Final   Pseudomonas aeruginosa NOT DETECTED NOT DETECTED Final   Candida albicans NOT DETECTED NOT DETECTED Final   Candida glabrata NOT DETECTED NOT DETECTED Final   Candida krusei NOT DETECTED NOT DETECTED Final   Candida parapsilosis NOT DETECTED NOT DETECTED Final   Candida tropicalis NOT DETECTED NOT DETECTED Final  Blood culture (routine x 2)     Status: None   Collection Time: 12/21/16  7:59 AM  Result Value Ref Range Status   Specimen Description BLOOD RIGHT WRIST  Final   Special Requests IN PEDIATRIC BOTTLE Blood Culture adequate volume  Final   Culture NO GROWTH 5 DAYS  Final   Report Status 12/26/2016 FINAL  Final  Respiratory Panel by PCR     Status: None   Collection Time: 12/21/16  2:47 PM  Result Value Ref Range Status   Adenovirus NOT DETECTED NOT DETECTED Final   Coronavirus 229E NOT  DETECTED NOT DETECTED Final   Coronavirus HKU1 NOT DETECTED NOT DETECTED Final   Coronavirus NL63 NOT DETECTED NOT DETECTED Final   Coronavirus OC43 NOT DETECTED NOT DETECTED Final   Metapneumovirus NOT DETECTED NOT DETECTED Final   Rhinovirus / Enterovirus NOT DETECTED NOT DETECTED Final   Influenza A NOT DETECTED NOT DETECTED Final   Influenza B NOT DETECTED NOT DETECTED Final   Parainfluenza Virus 1 NOT DETECTED NOT DETECTED Final   Parainfluenza Virus 2 NOT DETECTED NOT DETECTED Final   Parainfluenza Virus 3 NOT DETECTED NOT DETECTED Final   Parainfluenza Virus 4 NOT DETECTED NOT DETECTED Final   Respiratory Syncytial Virus NOT DETECTED NOT DETECTED Final   Bordetella pertussis NOT DETECTED NOT DETECTED Final   Chlamydophila pneumoniae NOT DETECTED NOT DETECTED Final   Mycoplasma pneumoniae NOT DETECTED NOT DETECTED Final  Culture, blood (Routine X 2) w Reflex to ID Panel     Status: None (Preliminary result)   Collection Time: 12/25/16 11:20 AM  Result Value Ref Range Status   Specimen Description BLOOD LEFT HAND  Final   Special Requests IN PEDIATRIC BOTTLE Blood Culture adequate volume  Final   Culture NO GROWTH 2 DAYS  Final   Report Status PENDING  Incomplete  Culture, blood (Routine X 2) w Reflex to ID Panel     Status: None (Preliminary result)   Collection Time: 12/25/16 11:25 AM  Result Value Ref Range Status   Specimen Description BLOOD LEFT HAND  Final   Special Requests IN PEDIATRIC BOTTLE Blood Culture adequate volume  Final   Culture NO GROWTH 2 DAYS  Final   Report Status PENDING  Incomplete  Culture, body fluid-bottle     Status: None (Preliminary result)   Collection Time: 12/26/16 11:48 AM  Result Value Ref Range Status   Specimen Description PLEURAL LEFT  Final   Special Requests NONE  Final   Culture NO GROWTH < 24 HOURS  Final   Report Status PENDING  Incomplete  Gram stain     Status: None   Collection Time: 12/26/16 11:48 AM  Result Value Ref Range  Status   Specimen Description PLEURAL LEFT  Final   Special Requests NONE  Final   Gram Stain   Final    MODERATE WBC PRESENT,BOTH PMN AND MONONUCLEAR NO ORGANISMS SEEN    Report Status 12/26/2016 FINAL  Final  Labs: Basic Metabolic Panel: Recent Labs  Lab 12/22/16 0319 12/23/16 0319 12/26/16 0450 12/27/16 0354 12/28/16 0449  NA 135 133* 135 134* 134*  K 3.7 4.0 4.4 5.4* 4.5  CL 105 102 103 103 101  CO2 23 24 26 26 25   GLUCOSE 101* 107* 114* 94 109*  BUN 5* 5* 7 10 12   CREATININE 1.03 1.08 0.95 0.95 0.97  CALCIUM 8.0* 7.7* 8.3* 8.5* 8.6*  MG  --   --  2.0  --   --    CBC: Recent Labs  Lab 12/22/16 0319 12/23/16 0319 12/26/16 0450 12/27/16 0354 12/28/16 0449  WBC 9.5 7.8 7.8 6.6 5.8  HGB 11.9* 11.8* 12.5* 12.4* 12.6*  HCT 33.9* 34.2* 36.3* 36.9* 36.9*  MCV 90.2 91.0 92.8 94.1 94.6  PLT 238 315 485* 529* 540*    SIGNED: Time coordinating discharge: 60 minutes  Debbora Presto, MD  [facility] 12/28/2016, 10:26 AM Pager [phone]  If 7PM-7AM, please contact night-coverage [email or web address] Password [email or web address]

## 2016-12-28 NOTE — Care Management Note (Signed)
Case Management Note  Patient Details  Name: Benedetto GoadLee Hyams MRN: [id number] Date of Birth: 02/13/1990  Subjective/Objective:                    Action/Plan: Pt discharging home with self care. Pt provided Good rx card to assist with the [facility] Pt in agreement. Pt has f/u PCP appointment scheduled.  Pt has transportation home.   Expected Discharge Date:  12/28/16               Expected Discharge Plan:  Home/Self Care  In-House Referral:     Discharge planning Services  CM Consult, Medication Assistance, Indigent Health Clinic  Post Acute Care Choice:    Choice offered to:     DME Arranged:    DME Agency:     HH Arranged:    HH Agency:     Status of Service:  Completed, signed off  If discussed at [employer] Meetings, dates discussed:    Additional Comments:  Kermit BaloKelli F Agustin Swatek, RN 12/28/2016, 11:14 AM

## 2016-12-30 LAB — CULTURE, BLOOD (ROUTINE X 2)
Culture: NO GROWTH
Culture: NO GROWTH
SPECIAL REQUESTS: ADEQUATE
Special Requests: ADEQUATE
Special Requests: ADEQUATE

## 2016-12-31 LAB — CULTURE, BODY FLUID-BOTTLE: CULTURE: NO GROWTH

## 2016-12-31 LAB — CULTURE, BODY FLUID W GRAM STAIN -BOTTLE

## 2017-01-03 ENCOUNTER — Telehealth: Payer: Self-pay | Admitting: Licensed Clinical Social Worker

## 2017-01-03 NOTE — Telephone Encounter (Signed)
[facility] inquire how he is doing and to see if he is ready to schedule an appointment.  Vergia AlbertsSherry Raymont Andreoni, [facility]

## 2017-01-07 ENCOUNTER — Ambulatory Visit: Payer: Self-pay | Admitting: Family Medicine

## 2017-01-10 ENCOUNTER — Encounter: Payer: Self-pay | Admitting: Family Medicine

## 2017-01-10 ENCOUNTER — Ambulatory Visit (INDEPENDENT_AMBULATORY_CARE_PROVIDER_SITE_OTHER): Payer: Self-pay | Admitting: Family Medicine

## 2017-01-10 VITALS — BP 126/84 | HR 66 | Temp 98.2°F | Resp 14 | Ht 73.0 in | Wt 236.0 lb

## 2017-01-10 DIAGNOSIS — M25569 Pain in unspecified knee: Secondary | ICD-10-CM

## 2017-01-10 DIAGNOSIS — J189 Pneumonia, unspecified organism: Secondary | ICD-10-CM

## 2017-01-10 MED ORDER — MELOXICAM 15 MG PO TABS: 15.0000 mg | ORAL_TABLET | Freq: Every day | ORAL | 0 refills | Status: DC

## 2017-01-10 NOTE — Patient Instructions (Addendum)
Knee pain, you can take Meloxicam 15 mg once daily as needed. This prescription has been sent to the pharmacy on file.       Community-Acquired Pneumonia, Adult Pneumonia is an infection of the lungs. One type of pneumonia can happen while a person is in a hospital. A different type can happen when a person is not in a hospital (community-acquired pneumonia). It is easy for this kind to spread from person to person. It can spread to you if you breathe near an infected person who coughs or sneezes. Some symptoms include:  A dry cough.  A wet (productive) cough.  Fever.  Sweating.  Chest pain.  Follow these instructions at home:  Take over-the-counter and prescription medicines only as told by your doctor. ? Only take cough medicine if you are losing sleep. ? If you were prescribed an antibiotic medicine, take it as told by your doctor. Do not stop taking the antibiotic even if you start to feel better.  Sleep with your head and neck raised (elevated). You can do this by putting a few pillows under your head, or you can sleep in a recliner.  Do not use tobacco products. These include cigarettes, chewing tobacco, and e-cigarettes. If you need help quitting, ask your doctor.  Drink enough water to keep your pee (urine) clear or pale yellow. A shot (vaccine) can help prevent pneumonia. Shots are often suggested for:  People older than 24 years of age.  People older than 24 years of age: ? Who are having cancer treatment. ? Who have long-term (chronic) lung disease. ? Who have problems with their body's defense system (immune system).  You may also prevent pneumonia if you take these actions:  Get the flu (influenza) shot every year.  Go to the dentist as often as told.  Wash your hands often. If soap and water are not available, use hand sanitizer.  Contact a doctor if:  You have a fever.  You lose sleep because your cough medicine does not help. Get help right away  if:  You are short of breath and it gets worse.  You have more chest pain.  Your sickness gets worse. This is very serious if: ? You are an older adult. ? Your body's defense system is weak.  You cough up blood. This information is not intended to replace advice given to you by your health care provider. Make sure you discuss any questions you have with your health care provider. Document Released: 07/14/2007 Document Revised: 07/03/2015 Document Reviewed: 05/22/2014 Elsevier Interactive Patient Education  2018 [employer]  Complete Blood Count Why am I having this test? A complete blood count is a group of tests that measures characteristics of three types of blood cells: white blood cells, red blood cells, and platelets. The following blood tests are included in a complete blood count:  White blood cell count. This test measures the number of white blood cells you have.  White blood cell differential. This test identifies the types of white blood cells you have and the concentration of each. It also identifies immature white blood cells.  Red blood cell count. This test measures the number of red blood cells you have.  Hemoglobin. This test measures the amount of hemoglobin in your blood.  Hematocrit. This test measures the percentage of space that red blood cells take up in your blood.  Mean corpuscular volume. This test measures the average size of your red blood cells.  Mean corpuscular hemoglobin. This  test measures of the average amount of hemoglobin inside each of your red blood cells.  Mean corpuscular hemoglobin concentration. This test calculates the average concentration of hemoglobin inside each of your red blood cells.  Red blood cell distribution width. This test measures the variation in the size of your red blood cells.  Platelet count. This test measures the concentration of platelets in your blood.  Mean platelet volume. This test measures the average size  of the platelets in your blood.  What kind of sample is taken? A blood sample is required for this test. It is usually collected by inserting a needle into a vein. How do I prepare for this test? There is no preparation required for this test. If this test is being performed in addition to other tests, your health care provider may ask you to fast before testing. What are the reference ranges? Reference ranges are considered healthy ranges established after testing a large group of healthy people. Reference ranges may vary among different people, labs, and hospitals. These ranges will be provided by the lab or department performing your tests. It is your responsibility to obtain your test results. Ask the lab or department performing the test when and how you will get your results. What do the results mean? If your results are outside of the reference ranges, you may have a condition or illness, such as anemia, an infection, a bleeding problem, or cancer. The following are some examples of abnormal results and their possible causes. Abnormal Results Related to [facility] Blood Cells  An abnormally low concentration of white blood cells can be caused by certain infections and by conditions that interfere with the production of white blood cells in your bone marrow.  An abnormally high concentration of white blood cells may be related to infections and conditions that cause inflammation or a blood-related cancer.  The presence of immature white blood cells may be related to an infection or a condition affecting your bone marrow. Abnormal Results Related to Red Blood Cells  An abnormally low concentration of red blood cells, hemoglobin, or hematocrit is called anemia.  An abnormally high concentration of red blood cells, hemoglobin, or hematocrit is called polycythemia. It may be related to mild thalassemia. Thalassemia is a type of anemia that is passed down through families (hereditary).  An  abnormally low mean corpuscular volume means that your red blood cells are smaller than normal. It may be related to thalassemia or iron deficiency anemia. Iron deficiency anemia is a type of anemia that results from a lack of iron.  An abnormally high mean corpuscular volume means that your red blood cells are larger than normal. This may be related to anemia caused by a lack of vitamin B12. It can also be caused by a lot of new red blood cells in your blood. This can happen after you suddenly lose a lot of blood.  An abnormally low mean corpuscular hemoglobin concentration can mean that you have a condition in which your hemoglobin is abnormally diluted inside the red cells. Examples of these types of conditions are iron deficiency anemia and thalassemia.  An abnormally high mean corpuscular hemoglobin concentration can mean that you have certain hemolytic anemias. Hemolytic anemia is anemia that results from the abnormal breakdown of your red blood cells.  An abnormally increased red cell distribution width may be related to certain anemias, sudden blood loss, or severe thalassemia. Abnormal Results Related to Platelets  An abnormally low concentration of platelets can be  a sign of a bleeding disorder.  An abnormally high concentration of platelets can occur with iron deficiency anemia, inflammatory disorders, and cancers. It can result from physical stresses, such as exercise or blood loss. It may also be a sign of a clotting disorder.  An abnormally high mean platelet volume can occur with certain bone marrow cancers and with a large increase in the number of new platelets being produced by your bone marrow. This can happen after the loss of a large amount of blood or the destruction of your platelets by antibodies. Talk with your health care provider to discuss your results, treatment options, and if necessary, the need for more tests. Talk with your health care provider if you have any  questions about your results. Talk with your health care provider to discuss your results, treatment options, and if necessary, the need for more tests. Talk with your health care provider if you have any questions about your results. This information is not intended to replace advice given to you by your health care provider. Make sure you discuss any questions you have with your health care provider. Document Released: 02/28/2004 Document Revised: 09/30/2015 Document Reviewed: 06/21/2013 Elsevier Interactive Patient Education  [employer]2018 Elsevier Inc.

## 2017-01-10 NOTE — Progress Notes (Signed)
Eric [name], is a 26 y.o. male  NMM:[id number]  RPR:[id number]  DOB - September 02, 1990  CC:  Chief Complaint  Patient presents with  . Establish Care  . Hospitalization Follow-up      HPI: Eric [name] is a 26 y.o. male is here today to establish care. Medical problems significant for HIV infection recently diagnosed in August 2018 and reactive depression.    Pneumonia  On 12/21/2016, he presented to the ED with a compliant of shortness of breath and was found to have multifocal pneumonia. Blood cultures were positive streptococcus group A.  He was treated with IV antibiotics and discharged after a seven days of an  inpatient stay on oral antibiotics. He reports resolution of shortness of breath, fatigue, chest tightness. He continues to cough (nonproductive )occasionally , and reports no wheezing. During his hospitalization he received his pneumonia vaccine  PPV23. He has remained negative of fever and reports completion of all outpatient medications.[name] is followed by infectious disease for management of HIV infection.  Patient is negative of headache,  chest pain, abdominal pain, nausea, no new weakness tingling or numbness, or shortness of breath.   Knee Pain Song reports bilateral knee pain. This has been present prior to diagnosis of pneumonia.  Denies prior injury or recent falls. Characterizes pain as aching. Pain is persistent regardless of activity. The pain is located to the patellar tendon only and is non radiating. He has not attempted relief with any medications.   Immunization History  Administered Date(s) Administered  . DTaP 02/19/1993, 05/20/1993, 09/08/1993, 08/18/1994, 02/21/1998  . Hepatitis A 12/21/2005, 07/29/2011  . Hepatitis B [birth year]1992, 01/08/1993, 08/18/1994  . HiB (PRP-OMP) 02/19/1993, 05/20/1993, 08/18/1994, 02/21/1998  . IPV 02/19/1993, 05/20/1993, 09/08/1993, 02/21/1998  . Influenza,inj,Quad PF,6+ Mos 12/22/2016  . MMR 08/18/1994, 02/21/1998  . Meningococcal  Conjugate 12/21/2005, 07/29/2011  . Pneumococcal Polysaccharide-23 12/22/2016  . Td 07/29/2011  . Tdap 07/29/2011  . Varicella 05/16/1995, 07/29/2011      No Known Allergies Past Medical History:  Diagnosis Date  . Depression 09/22/2016  . Glaucoma    currently untreated   . HIV (human immunodeficiency virus infection) ([place]) 09/22/2016   Current Outpatient Medications on File Prior to Visit  Medication Sig Dispense Refill  . bictegravir-emtricitabine-tenofovir AF (BIKTARVY) 50-200-25 MG TABS tablet Take 1 tablet by mouth daily. Try to take at the same time each day with or without food. 30 tablet 5  . acetaminophen (TYLENOL) 325 MG tablet Take 650 mg every 6 (six) hours as needed by mouth for mild pain.    Marland Kitchen amoxicillin (AMOXIL) 500 MG capsule Take 1 capsule (500 mg total) by mouth every 8 (eight) hours. 21 capsule 0  . HYDROcodone-acetaminophen (NORCO/VICODIN) 5-325 MG tablet Take 1 tablet every 6 (six) hours as needed by mouth for moderate pain. (Patient not taking: Reported on 01/10/2017) 30 tablet 0  . ibuprofen (ADVIL,MOTRIN) 200 MG tablet Take 200-800 mg every 6 (six) hours as needed by mouth for moderate pain.     No current facility-administered medications on file prior to visit.    Family History  Problem Relation Age of Onset  . Hypertension Father   . Diabetes Father   . Healthy Brother   . Hypertension Maternal Grandmother   . Kidney disease Maternal Grandmother    Social History   Socioeconomic History  . Marital status: Single    Spouse name: Not on file  . Number of children: Not on file  . Years of education: Not on file  .  Highest education level: Not on file  Social Needs  . Financial resource strain: Not on file  . Food insecurity - worry: Not on file  . Food insecurity - inability: Not on file  . Transportation needs - medical: Not on file  . Transportation needs - non-medical: Not on file  Occupational History  . Not on file  Tobacco Use  .  Smoking status: Never Smoker  . Smokeless tobacco: Never Used  Substance and Sexual Activity  . Alcohol use: Yes    Comment: social  . Drug use: No  . Sexual activity: No  Other Topics Concern  . Not on file  Social History Narrative  . Not on file   Depression screen [facility] 2/9 01/10/2017 10/05/2016  Decreased Interest 0 0  Down, Depressed, Hopeless 0 0  PHQ - 2 Score 0 0    Review of Systems: Constitutional: Negative for fever, chills, diaphoresis, activity change, appetite change and fatigue. HENT: Negative for ear pain, nosebleeds, congestion, facial swelling, rhinorrhea, neck pain, neck stiffness and ear discharge.  Eyes: Negative for pain, discharge, redness, itching and visual disturbance. Respiratory: Negative for cough, choking, chest tightness, shortness of breath, wheezing and stridor.  Cardiovascular: Negative for chest pain, palpitations and leg swelling. Gastrointestinal: Negative for abdominal distention. Genitourinary: Negative for dysuria, urgency, frequency, hematuria, flank pain, decreased urine volume, difficulty urinating and dyspareunia.  Musculoskeletal: Negative for back pain, joint swelling, Positive for knee arthralgia and gait problem. Neurological: Negative for dizziness, tremors, seizures, syncope, facial asymmetry, speech difficulty, weakness, light-headedness, numbness and headaches.  Hematological: Negative for adenopathy. Does not bruise/bleed easily. Psychiatric/Behavioral: Negative for hallucinations, behavioral problems, confusion, dysphoric mood, decreased concentration and agitation.    Objective:   Vitals:   01/10/17 0823  BP: 126/84  Pulse: 66  Resp: 14  Temp: 98.2 F (36.8 C)  SpO2: 100%    Physical Exam: Constitutional: Patient appears well-developed and well-nourished. No distress. HENT: Normocephalic, atraumatic, External right and left ear normal. Oropharynx is clear and moist.  Eyes: Conjunctivae and EOM are normal. PERRLA, no  scleral icterus. Neck: Normal ROM. Neck supple. No JVD. No tracheal deviation. No thyromegaly. CVS: RRR, S1/S2 +, no murmurs, no gallops, no carotid bruit.  Pulmonary: Effort and breath sounds normal, no stridor, rhonchi, wheezes, rales.  Abdominal: Soft. BS +, no distension, tenderness, rebound or guarding.  Musculoskeletal: Normal range of motion. No edema and no tenderness.  Lymphadenopathy: No lymphadenopathy noted, cervical. Neuro: Alert. Normal patellar tendon reflexes. Norma;  muscle tone coordination. No cranial nerve deficit. Skin: Skin is warm and dry. No rash noted. Not diaphoretic. No erythema. No pallor. Psychiatric: Normal mood and affect. Behavior, judgment, thought content normal.  Lab Results  Component Value Date   WBC 5.8 12/28/2016   HGB 12.6 (L) 12/28/2016   HCT 36.9 (L) 12/28/2016   MCV 94.6 12/28/2016   PLT 540 (H) 12/28/2016   Lab Results  Component Value Date   CREATININE 0.97 12/28/2016   BUN 12 12/28/2016   NA 134 (L) 12/28/2016   K 4.5 12/28/2016   CL 101 12/28/2016   CO2 25 12/28/2016    No results found for: HGBA1C Lipid Panel     Component Value Date/Time   CHOL 139 09/22/2016 1652   TRIG 78 09/22/2016 1652   HDL 34 (L) 09/22/2016 1652   CHOLHDL 4.1 09/22/2016 1652   VLDL 16 09/22/2016 1652   LDLCALC 89 09/22/2016 1652        Assessment and plan:  1.  Pneumonia of both lungs due to infectious organism, unspecified part of lung, resolving. Symptoms for the most part have completely resolved. Will check CBC and CMP today. Patient will follow-up in 6 weeks for repeat check x-ray to evaluate for resolution of pneumonia/    2. Knee pain, unspecified chronicity, unspecified laterality, will trial antiinflammatory to attempt relief of arthralgia. If no improvement, will consider imaging . No palpable effusion noted on exam .  Meds ordered this encounter  Medications  . meloxicam (MOBIC) 15 MG tablet    Sig: Take 1 tablet (15 mg total) by mouth  daily.    Dispense:  30 tablet    Refill:  0    Order Specific Question:   Supervising Provider    Answer:   Tresa Garter [id number]   Return in about 5 weeks (around 02/14/2017), or CXR and follow-up on [place] .   The patient was given clear instructions to go to ER or return to medical center if symptoms don't improve, worsen or new problems develop. The patient verbalized understanding. The patient was told to call to get lab results if they haven't heard anything in the next week.     This note has been created with Surveyor, quantity. Any transcriptional errors are unintentional.

## 2017-01-11 LAB — BASIC METABOLIC PANEL
BUN/Creatinine Ratio: 9 (ref 9–20)
BUN: 10 mg/dL (ref 6–20)
CALCIUM: 9.1 mg/dL (ref 8.7–10.2)
CHLORIDE: 103 mmol/L (ref 96–106)
CO2: 26 mmol/L (ref 20–29)
CREATININE: 1.06 mg/dL (ref 0.76–1.27)
GFR calc non Af Amer: 98 mL/min/{1.73_m2} (ref >59)
GFR, EST AFRICAN AMERICAN: 113 mL/min/{1.73_m2} (ref >59)
GLUCOSE: 85 mg/dL (ref 65–99)
Potassium: 4.3 mmol/L (ref 3.5–5.2)
Sodium: 141 mmol/L (ref 134–144)

## 2017-01-14 ENCOUNTER — Telehealth: Payer: Self-pay | Admitting: Licensed Clinical Social Worker

## 2017-01-14 NOTE — Telephone Encounter (Signed)
[facility] called patient to reschedule appointment due to expected inclement weather.  Little Bashore, LPC 

## 2017-01-17 ENCOUNTER — Ambulatory Visit: Payer: Self-pay | Admitting: Licensed Clinical Social Worker

## 2017-01-20 ENCOUNTER — Ambulatory Visit: Payer: Self-pay | Admitting: Licensed Clinical Social Worker

## 2017-01-20 ENCOUNTER — Telehealth: Payer: Self-pay | Admitting: Licensed Clinical Social Worker

## 2017-01-20 NOTE — Telephone Encounter (Signed)
[facility] and to see if the patient would like to reschedule due to the weather.  [facility] a return call.  Vergia AlbertsSherry Domanik [place], [facility]

## 2017-01-21 NOTE — Telephone Encounter (Signed)
Patient returned Sherry's call today, asked to be rescheduled with her on the same day as his appointment with Dr Drue SecondSnider. Patient will come 12/19 at 3:00. Andree CossHowell, Dulcie Gammon M, RN

## 2017-01-24 ENCOUNTER — Telehealth: Payer: Self-pay | Admitting: Licensed Clinical Social Worker

## 2017-01-24 NOTE — Telephone Encounter (Signed)
[facility] for next appointment next week.  Vergia AlbertsSherry Teigan Sahli, [facility]

## 2017-01-26 ENCOUNTER — Ambulatory Visit (INDEPENDENT_AMBULATORY_CARE_PROVIDER_SITE_OTHER): Payer: Self-pay | Admitting: Internal Medicine

## 2017-01-26 ENCOUNTER — Encounter: Payer: Self-pay | Admitting: Internal Medicine

## 2017-01-26 ENCOUNTER — Ambulatory Visit: Payer: Self-pay

## 2017-01-26 VITALS — BP 133/84 | HR 69 | Temp 98.2°F | Ht 74.0 in | Wt 242.0 lb

## 2017-01-26 DIAGNOSIS — M170 Bilateral primary osteoarthritis of knee: Secondary | ICD-10-CM

## 2017-01-26 DIAGNOSIS — B20 Human immunodeficiency virus [HIV] disease: Secondary | ICD-10-CM

## 2017-01-26 DIAGNOSIS — Z23 Encounter for immunization: Secondary | ICD-10-CM

## 2017-01-26 NOTE — Patient Instructions (Signed)
Get labs 2 wk prior to next visit

## 2017-01-26 NOTE — Progress Notes (Signed)
Patient ID: Eric [name], male   DOB: 02/27/1990, 26 y.o.   MRN: [id number]  HPI 26yo M with recently diagnosed hiv disease, cd 4 count of 470/VL 40, on biktarvy. He has recently been hospitalized for complicated by strep pyogenes multifocal pneumonia plus bacteremia.great adherence with biktarvy. No sex. Back at work full time  Doing well except bilateral knee pain  Outpatient Encounter Medications as of 01/26/2017  Medication Sig  . acetaminophen (TYLENOL) 325 MG tablet Take 650 mg every 6 (six) hours as needed by mouth for mild pain.  . bictegravir-emtricitabine-tenofovir AF (BIKTARVY) 50-200-25 MG TABS tablet Take 1 tablet by mouth daily. Try to take at the same time each day with or without food.  Marland Kitchen. ibuprofen (ADVIL,MOTRIN) 200 MG tablet Take 200-800 mg every 6 (six) hours as needed by mouth for moderate pain.  . [DISCONTINUED] amoxicillin (AMOXIL) 500 MG capsule Take 1 capsule (500 mg total) by mouth every 8 (eight) hours.  Marland Kitchen. HYDROcodone-acetaminophen (NORCO/VICODIN) 5-325 MG tablet Take 1 tablet every 6 (six) hours as needed by mouth for moderate pain. (Patient not taking: Reported on 01/26/2017)  . meloxicam (MOBIC) 15 MG tablet Take 1 tablet (15 mg total) by mouth daily. (Patient not taking: Reported on 01/26/2017)   No facility-administered encounter medications on file as of 01/26/2017.      Patient Active Problem List   Diagnosis Date Noted  . S/P thoracentesis   . Sepsis due to Streptococcus, group A (HCC)   . Bacteremia   . Multifocal pneumonia 12/21/2016  . AKI (acute kidney injury) (HCC) 12/21/2016  . Hypotension 12/21/2016  . HIV (human immunodeficiency virus infection) (HCC) 09/22/2016  . Healthcare maintenance 09/22/2016  . Depression 09/22/2016     There are no preventive care reminders to display for this patient.   Review of Systems 12 point ros is negatvie except knee pain Physical Exam   BP 133/84   Pulse 69   Temp 98.2 F (36.8 C) (Oral)    Ht 6\' 2"  (1.88 m)   Wt 242 lb (109.8 kg)   BMI 31.07 kg/m   Physical Exam  Constitutional: He is oriented to person, place, and time. He appears well-developed and well-nourished. No distress.  HENT:  Mouth/Throat: Oropharynx is clear and moist. No oropharyngeal exudate.  Cardiovascular: Normal rate, regular rhythm and normal heart sounds. Exam reveals no gallop and no friction rub.  No murmur heard.  Pulmonary/Chest: Effort normal and breath sounds normal. No respiratory distress. He has no wheezes.  Lymphadenopathy:  He has no cervical adenopathy.  Neurological: He is alert and oriented to person, place, and time.  Skin: Skin is warm and dry. No rash noted. No erythema.  Psychiatric: He has a normal mood and affect. His behavior is normal.    Lab Results  Component Value Date   CD4TCELL 32 (L) 12/22/2016   Lab Results  Component Value Date   CD4TABS 470 12/22/2016   CD4TABS 300 (L) 09/22/2016   Lab Results  Component Value Date   HIV1RNAQUANT 40 12/22/2016   Lab Results  Component Value Date   HEPBSAB NON-REACTIVE 09/22/2016   Lab Results  Component Value Date   LABRPR NON REAC 09/22/2016    CBC Lab Results  Component Value Date   WBC 5.8 12/28/2016   RBC 3.90 (L) 12/28/2016   HGB 12.6 (L) 12/28/2016   HCT 36.9 (L) 12/28/2016   PLT 540 (H) 12/28/2016   MCV 94.6 12/28/2016   MCH 32.3 12/28/2016   MCHC  34.1 12/28/2016   RDW 13.6 12/28/2016   LYMPHSABS 1.2 12/20/2016   MONOABS 1.4 (H) 12/20/2016   EOSABS 0.0 12/20/2016    BMET Lab Results  Component Value Date   NA 141 01/10/2017   K 4.3 01/10/2017   CL 103 01/10/2017   CO2 26 01/10/2017   GLUCOSE 85 01/10/2017   BUN 10 01/10/2017   CREATININE 1.06 01/10/2017   CALCIUM 9.1 01/10/2017   GFRNONAA 98 01/10/2017   GFRAA 113 01/10/2017      Assessment and Plan  hiv disease = will check viral load 2 wk prior to next appt. Continue with biktarvy  oa of knees = will monitor ? Post streptococcal  arthralgia  Health maintennace = hpv vaccine- will start series

## 2017-02-02 ENCOUNTER — Ambulatory Visit: Payer: Self-pay | Admitting: Licensed Clinical Social Worker

## 2017-02-03 ENCOUNTER — Telehealth: Payer: Self-pay | Admitting: Licensed Clinical Social Worker

## 2017-02-03 NOTE — Telephone Encounter (Signed)
[facility] and could not leave a message.  Eric AlbertsSherry [place] [name], [facility]

## 2017-02-14 ENCOUNTER — Ambulatory Visit: Payer: Self-pay | Admitting: Family Medicine

## 2017-03-09 ENCOUNTER — Ambulatory Visit: Payer: Self-pay

## 2017-03-24 ENCOUNTER — Ambulatory Visit (INDEPENDENT_AMBULATORY_CARE_PROVIDER_SITE_OTHER): Payer: Self-pay | Admitting: Internal Medicine

## 2017-03-24 ENCOUNTER — Encounter: Payer: Self-pay | Admitting: Internal Medicine

## 2017-03-24 VITALS — BP 129/87 | HR 87 | Temp 98.3°F | Wt 251.0 lb

## 2017-03-24 DIAGNOSIS — B20 Human immunodeficiency virus [HIV] disease: Secondary | ICD-10-CM

## 2017-03-24 DIAGNOSIS — J029 Acute pharyngitis, unspecified: Secondary | ICD-10-CM

## 2017-03-24 DIAGNOSIS — B9789 Other viral agents as the cause of diseases classified elsewhere: Secondary | ICD-10-CM

## 2017-03-24 DIAGNOSIS — Z23 Encounter for immunization: Secondary | ICD-10-CM

## 2017-03-24 DIAGNOSIS — J028 Acute pharyngitis due to other specified organisms: Secondary | ICD-10-CM

## 2017-03-24 DIAGNOSIS — Z0000 Encounter for general adult medical examination without abnormal findings: Secondary | ICD-10-CM

## 2017-03-24 DIAGNOSIS — Z113 Encounter for screening for infections with a predominantly sexual mode of transmission: Secondary | ICD-10-CM

## 2017-03-24 NOTE — Progress Notes (Signed)
RFV: follow up for hiv disease  Patient ID: Eric [name] [name], male   DOB: 10/10/1990, 27 y.o.   MRN: [id number]  HPI Eric [name] is a 27yo M with hiv disease, CD 4 count of 470/VL 40 ( in nov) on biktarvy. missed 1-2 doses since November. He has 1 da hx of sore throat/noticed his tonsils are swollen. No other respiratory symptoms or nasal congestion. He is contact with the public with his 2 jobs   He has had protected sex with 1 partner in the last 3 months.  Outpatient Encounter Medications as of 03/24/2017  Medication Sig  . acetaminophen (TYLENOL) 325 MG tablet Take 650 mg every 6 (six) hours as needed by mouth for mild pain.  . bictegravir-emtricitabine-tenofovir AF (BIKTARVY) 50-200-25 MG TABS tablet Take 1 tablet by mouth daily. Try to take at the same time each day with or without food.  Marland Kitchen. ibuprofen (ADVIL,MOTRIN) 200 MG tablet Take 200-800 mg every 6 (six) hours as needed by mouth for moderate pain.  . [DISCONTINUED] HYDROcodone-acetaminophen (NORCO/VICODIN) 5-325 MG tablet Take 1 tablet every 6 (six) hours as needed by mouth for moderate pain. (Patient not taking: Reported on 01/26/2017)  . [DISCONTINUED] meloxicam (MOBIC) 15 MG tablet Take 1 tablet (15 mg total) by mouth daily. (Patient not taking: Reported on 01/26/2017)   No facility-administered encounter medications on file as of 03/24/2017.      Patient Active Problem List   Diagnosis Date Noted  . S/P thoracentesis   . Sepsis due to Streptococcus, group A (HCC)   . Bacteremia   . Multifocal pneumonia 12/21/2016  . AKI (acute kidney injury) (HCC) 12/21/2016  . Hypotension 12/21/2016  . HIV (human immunodeficiency virus infection) (HCC) 09/22/2016  . Healthcare maintenance 09/22/2016  . Depression 09/22/2016     There are no preventive care reminders to display for this patient.   Review of Systems Per hpi, otherwise 12 point is negative Physical Exam   BP 129/87   Pulse 87   Temp 98.3 F (36.8 C) (Oral)   Wt 251 lb  (113.9 kg)   BMI 32.23 kg/m   Physical Exam  Constitutional: He is oriented to person, place, and time. He appears well-developed and well-nourished. No distress.  HENT:  Mouth/Throat: Oropharynx is clear and moist. No oropharyngeal exudate.  Cardiovascular: Normal rate, regular rhythm and normal heart sounds. Exam reveals no gallop and no friction rub.  No murmur heard.  Pulmonary/Chest: Effort normal and breath sounds normal. No respiratory distress. He has no wheezes.  Abdominal: Soft. Bowel sounds are normal. He exhibits no distension. There is no tenderness.  Lymphadenopathy:  He has no cervical adenopathy.  Neurological: He is alert and oriented to person, place, and time.  Skin: Skin is warm and dry. No rash noted. No erythema.  Psychiatric: He has a normal mood and affect. His behavior is normal.    Lab Results  Component Value Date   CD4TCELL 32 (L) 12/22/2016   Lab Results  Component Value Date   CD4TABS 470 12/22/2016   CD4TABS 300 (L) 09/22/2016   Lab Results  Component Value Date   HIV1RNAQUANT 40 12/22/2016   Lab Results  Component Value Date   HEPBSAB NON-REACTIVE 09/22/2016   Lab Results  Component Value Date   LABRPR NON REAC 09/22/2016    CBC Lab Results  Component Value Date   WBC 5.8 12/28/2016   RBC 3.90 (L) 12/28/2016   HGB 12.6 (L) 12/28/2016   HCT 36.9 (L) 12/28/2016   PLT  540 (H) 12/28/2016   MCV 94.6 12/28/2016   MCH 32.3 12/28/2016   MCHC 34.1 12/28/2016   RDW 13.6 12/28/2016   LYMPHSABS 1.2 12/20/2016   MONOABS 1.4 (H) 12/20/2016   EOSABS 0.0 12/20/2016    BMET Lab Results  Component Value Date   NA 141 01/10/2017   K 4.3 01/10/2017   CL 103 01/10/2017   CO2 26 01/10/2017   GLUCOSE 85 01/10/2017   BUN 10 01/10/2017   CREATININE 1.06 01/10/2017   CALCIUM 9.1 01/10/2017   GFRNONAA 98 01/10/2017   GFRAA 113 01/10/2017      Assessment and Plan  Sore throat = exam is benign. Recommend to use prn ibuprofen. Possibly  viral. Did not swab for strep throat at this visit unless it worsens  hiv disease = will labs, plan to continue on biktarvy  Health maintenance = will do hpv #2 today  rtc in 3 months

## 2017-03-25 LAB — COMPLETE METABOLIC PANEL WITH GFR
AG RATIO: 1.4 (calc) (ref 1.0–2.5)
ALT: 10 U/L (ref 9–46)
AST: 18 U/L (ref 10–40)
Albumin: 4 g/dL (ref 3.6–5.1)
Alkaline phosphatase (APISO): 52 U/L (ref 40–115)
BILIRUBIN TOTAL: 0.5 mg/dL (ref 0.2–1.2)
BUN: 12 mg/dL (ref 7–25)
CALCIUM: 9 mg/dL (ref 8.6–10.3)
CHLORIDE: 104 mmol/L (ref 98–110)
CO2: 26 mmol/L (ref 20–32)
Creat: 1.02 mg/dL (ref 0.60–1.35)
GFR, EST NON AFRICAN AMERICAN: 102 mL/min/{1.73_m2} (ref >=60)
GFR, Est African American: 119 mL/min/{1.73_m2} (ref >=60)
GLOBULIN: 2.8 g/dL (ref 1.9–3.7)
Glucose, Bld: 86 mg/dL (ref 65–99)
POTASSIUM: 4.2 mmol/L (ref 3.5–5.3)
SODIUM: 139 mmol/L (ref 135–146)
Total Protein: 6.8 g/dL (ref 6.1–8.1)

## 2017-03-25 LAB — CBC WITH DIFFERENTIAL/PLATELET
Basophils Absolute: 40 cells/uL (ref 0–200)
Basophils Relative: 0.8 %
EOS PCT: 3.2 %
Eosinophils Absolute: 160 cells/uL (ref 15–500)
HCT: 41.4 % (ref 38.5–50.0)
Hemoglobin: 14.5 g/dL (ref 13.2–17.1)
Lymphs Abs: 735 cells/uL — ABNORMAL LOW (ref 850–3900)
MCH: 31.7 pg (ref 27.0–33.0)
MCHC: 35 g/dL (ref 32.0–36.0)
MCV: 90.6 fL (ref 80.0–100.0)
MONOS PCT: 10.2 %
MPV: 9.9 fL (ref 7.5–12.5)
Neutro Abs: 3555 cells/uL (ref 1500–7800)
Neutrophils Relative %: 71.1 %
PLATELETS: 270 10*3/uL (ref 140–400)
RBC: 4.57 10*6/uL (ref 4.20–5.80)
RDW: 12.4 % (ref 11.0–15.0)
TOTAL LYMPHOCYTE: 14.7 %
WBC: 5 10*3/uL (ref 3.8–10.8)
WBCMIX: 510 {cells}/uL (ref 200–950)

## 2017-03-25 LAB — T-HELPER CELL (CD4) - (RCID CLINIC ONLY)
CD4 % Helper T Cell: 36 % (ref 33–55)
CD4 T Cell Abs: 330 /uL — ABNORMAL LOW (ref 400–2700)

## 2017-03-25 LAB — RPR: RPR Ser Ql: NONREACTIVE

## 2017-03-26 LAB — HIV-1 RNA QUANT-NO REFLEX-BLD
HIV 1 RNA Quant: <20 copies/mL
HIV-1 RNA Quant, Log: <1.3 Log copies/mL

## 2017-04-01 ENCOUNTER — Encounter: Payer: Self-pay | Admitting: Internal Medicine

## 2017-04-13 ENCOUNTER — Other Ambulatory Visit: Payer: Self-pay | Admitting: Infectious Diseases

## 2017-04-13 DIAGNOSIS — B20 Human immunodeficiency virus [HIV] disease: Secondary | ICD-10-CM

## 2017-07-11 ENCOUNTER — Ambulatory Visit: Payer: Self-pay | Admitting: Internal Medicine

## 2017-07-19 ENCOUNTER — Ambulatory Visit (INDEPENDENT_AMBULATORY_CARE_PROVIDER_SITE_OTHER): Payer: Self-pay | Admitting: Internal Medicine

## 2017-07-19 ENCOUNTER — Encounter: Payer: Self-pay | Admitting: Internal Medicine

## 2017-07-19 VITALS — BP 123/76 | HR 61 | Temp 98.1°F | Wt 255.0 lb

## 2017-07-19 DIAGNOSIS — Z23 Encounter for immunization: Secondary | ICD-10-CM

## 2017-07-19 DIAGNOSIS — B20 Human immunodeficiency virus [HIV] disease: Secondary | ICD-10-CM

## 2017-07-19 NOTE — Progress Notes (Signed)
Patient ID: Eric [name], male   DOB: 1990/10/21, 27 y.o.   MRN: [id number]  HPI 26yo M with HIV disease, CD 4 count of 330/VL<20 (feb 2019) on biktarvy. Has been working full time at [employer]. Unclear if getting health insurance. Doing well with adherence.  Has had 2 male partners but not seeing currently. Using condoms. No other symptoms  Outpatient Encounter Medications as of 07/19/2017  Medication Sig  . acetaminophen (TYLENOL) 325 MG tablet Take 650 mg every 6 (six) hours as needed by mouth for mild pain.  Marland Kitchen BIKTARVY 50-200-25 MG TABS tablet TAKE 1 TABLET BY MOUTH DAILY AT THE SAME TIME EACH DAY WITH OR WITHOUT FOOD  . ibuprofen (ADVIL,MOTRIN) 200 MG tablet Take 200-800 mg every 6 (six) hours as needed by mouth for moderate pain.   No facility-administered encounter medications on file as of 07/19/2017.      Patient Active Problem List   Diagnosis Date Noted  . S/P thoracentesis   . Sepsis due to Streptococcus, group A (HCC)   . Bacteremia   . Multifocal pneumonia 12/21/2016  . AKI (acute kidney injury) (HCC) 12/21/2016  . Hypotension 12/21/2016  . HIV (human immunodeficiency virus infection) (HCC) 09/22/2016  . Healthcare maintenance 09/22/2016  . Depression 09/22/2016     There are no preventive care reminders to display for this patient.  Social History   Tobacco Use  . Smoking status: Never Smoker  . Smokeless tobacco: Never Used  Substance Use Topics  . Alcohol use: Yes    Comment: social   Review of Systems Review of Systems  Constitutional: Negative for fever, chills, diaphoresis, activity change, appetite change, fatigue and unexpected weight change.  HENT: Negative for congestion, sore throat, rhinorrhea, sneezing, trouble swallowing and sinus pressure.  Eyes: Negative for photophobia and visual disturbance.  Respiratory: Negative for cough, chest tightness, shortness of breath, wheezing and stridor.  Cardiovascular: Negative for chest pain,  palpitations and leg swelling.  Gastrointestinal: Negative for nausea, vomiting, abdominal pain, diarrhea, constipation, blood in stool, abdominal distention and anal bleeding.  Genitourinary: Negative for dysuria, hematuria, flank pain and difficulty urinating.  Musculoskeletal: Negative for myalgias, back pain, joint swelling, arthralgias and gait problem.  Skin: Negative for color change, pallor, rash and wound.  Neurological: Negative for dizziness, tremors, weakness and light-headedness.  Hematological: Negative for adenopathy. Does not bruise/bleed easily.  Psychiatric/Behavioral: Negative for behavioral problems, confusion, sleep disturbance, dysphoric mood, decreased concentration and agitation.    Physical Exam   BP 123/76   Pulse 61   Temp 98.1 F (36.7 C) (Oral)   Wt 255 lb (115.7 kg)   BMI 32.74 kg/m   Physical Exam  Constitutional: He is oriented to person, place, and time. He appears well-developed and well-nourished. No distress.  HENT:  Mouth/Throat: Oropharynx is clear and moist. No oropharyngeal exudate.  Cardiovascular: Normal rate, regular rhythm and normal heart sounds. Exam reveals no gallop and no friction rub.  No murmur heard.  Pulmonary/Chest: Effort normal and breath sounds normal. No respiratory distress. He has no wheezes.  Lymphadenopathy:  He has no cervical adenopathy.  Neurological: He is alert and oriented to person, place, and time.  Skin: Skin is warm and dry. No rash noted. No erythema.  Psychiatric: He has a normal mood and affect. His behavior is normal.    Lab Results  Component Value Date   CD4TCELL 36 03/24/2017   Lab Results  Component Value Date   CD4TABS 330 (L) 03/24/2017   CD4TABS  470 12/22/2016   CD4TABS 300 (L) 09/22/2016   Lab Results  Component Value Date   HIV1RNAQUANT <20 NOT DETECTED 03/24/2017   Lab Results  Component Value Date   HEPBSAB NON-REACTIVE 09/22/2016   Lab Results  Component Value Date   LABRPR  NON-REACTIVE 03/24/2017    CBC Lab Results  Component Value Date   WBC 5.0 03/24/2017   RBC 4.57 03/24/2017   HGB 14.5 03/24/2017   HCT 41.4 03/24/2017   PLT 270 03/24/2017   MCV 90.6 03/24/2017   MCH 31.7 03/24/2017   MCHC 35.0 03/24/2017   RDW 12.4 03/24/2017   LYMPHSABS 735 (L) 03/24/2017   MONOABS 1.4 (H) 12/20/2016   EOSABS 160 03/24/2017    BMET Lab Results  Component Value Date   NA 139 03/24/2017   K 4.2 03/24/2017   CL 104 03/24/2017   CO2 26 03/24/2017   GLUCOSE 86 03/24/2017   BUN 12 03/24/2017   CREATININE 1.02 03/24/2017   CALCIUM 9.0 03/24/2017   GFRNONAA 102 03/24/2017   GFRAA 119 03/24/2017      Assessment and Plan  hiv disease = well controlled in Feb 2019 labs. Continue on current medications. Will check labs at next visit  Health maintenance = hpv #3 today - prevnar not due until nov 2019

## 2017-09-26 ENCOUNTER — Other Ambulatory Visit: Payer: Self-pay | Admitting: Infectious Diseases

## 2017-09-26 DIAGNOSIS — B20 Human immunodeficiency virus [HIV] disease: Secondary | ICD-10-CM

## 2017-10-13 ENCOUNTER — Ambulatory Visit: Payer: Self-pay

## 2017-10-20 ENCOUNTER — Telehealth: Payer: Self-pay

## 2017-10-20 NOTE — Telephone Encounter (Signed)
Patient called today to speak with Financial counselor Marcelino DusterMichelle regarding paper work he submitted a week ago. Spoke with Olegario MessierKathy, Financial counselor who stated she would relay the message on patients behalf.  Lorenso CourierJose L Maldonado, [place]

## 2017-11-03 ENCOUNTER — Other Ambulatory Visit: Payer: Self-pay | Admitting: *Deleted

## 2017-11-03 DIAGNOSIS — B20 Human immunodeficiency virus [HIV] disease: Secondary | ICD-10-CM

## 2017-11-07 ENCOUNTER — Other Ambulatory Visit: Payer: Self-pay

## 2017-11-07 DIAGNOSIS — B20 Human immunodeficiency virus [HIV] disease: Secondary | ICD-10-CM

## 2017-11-08 LAB — T-HELPER CELL (CD4) - (RCID CLINIC ONLY)
CD4 % Helper T Cell: 26 % — ABNORMAL LOW (ref 33–55)
CD4 T Cell Abs: 620 /uL (ref 400–2700)

## 2017-11-09 LAB — COMPLETE METABOLIC PANEL WITH GFR
AG Ratio: 1.3 (calc) (ref 1.0–2.5)
ALBUMIN MSPROF: 4.1 g/dL (ref 3.6–5.1)
ALKALINE PHOSPHATASE (APISO): 57 U/L (ref 40–115)
ALT: 14 U/L (ref 9–46)
AST: 19 U/L (ref 10–40)
BUN: 10 mg/dL (ref 7–25)
CO2: 24 mmol/L (ref 20–32)
Calcium: 9.1 mg/dL (ref 8.6–10.3)
Chloride: 101 mmol/L (ref 98–110)
Creat: 1.12 mg/dL (ref 0.60–1.35)
GFR, Est African American: 106 mL/min/{1.73_m2} (ref >=60)
GFR, Est Non African American: 91 mL/min/{1.73_m2} (ref >=60)
GLOBULIN: 3.1 g/dL (ref 1.9–3.7)
Glucose, Bld: 89 mg/dL (ref 65–99)
POTASSIUM: 4.2 mmol/L (ref 3.5–5.3)
Sodium: 134 mmol/L — ABNORMAL LOW (ref 135–146)
Total Bilirubin: 0.6 mg/dL (ref 0.2–1.2)
Total Protein: 7.2 g/dL (ref 6.1–8.1)

## 2017-11-09 LAB — CBC WITH DIFFERENTIAL/PLATELET
BASOS ABS: 29 {cells}/uL (ref 0–200)
BASOS PCT: 0.8 %
Eosinophils Absolute: 130 cells/uL (ref 15–500)
Eosinophils Relative: 3.6 %
HEMATOCRIT: 43.4 % (ref 38.5–50.0)
Hemoglobin: 15.3 g/dL (ref 13.2–17.1)
Lymphs Abs: 2128 cells/uL (ref 850–3900)
MCH: 32 pg (ref 27.0–33.0)
MCHC: 35.3 g/dL (ref 32.0–36.0)
MCV: 90.8 fL (ref 80.0–100.0)
MPV: 10 fL (ref 7.5–12.5)
Monocytes Relative: 9.4 %
NEUTROS PCT: 27.1 %
Neutro Abs: 976 cells/uL — ABNORMAL LOW (ref 1500–7800)
PLATELETS: 266 10*3/uL (ref 140–400)
RBC: 4.78 10*6/uL (ref 4.20–5.80)
RDW: 12.3 % (ref 11.0–15.0)
Total Lymphocyte: 59.1 %
WBC mixed population: 338 cells/uL (ref 200–950)
WBC: 3.6 10*3/uL — AB (ref 3.8–10.8)

## 2017-11-09 LAB — HIV-1 RNA QUANT-NO REFLEX-BLD
HIV 1 RNA Quant: <20 copies/mL
HIV-1 RNA Quant, Log: <1.3 Log copies/mL

## 2017-11-21 ENCOUNTER — Encounter: Payer: Self-pay | Admitting: Internal Medicine

## 2017-11-21 ENCOUNTER — Ambulatory Visit (INDEPENDENT_AMBULATORY_CARE_PROVIDER_SITE_OTHER): Payer: Self-pay | Admitting: Internal Medicine

## 2017-11-21 VITALS — BP 127/80 | HR 81 | Temp 98.1°F | Ht 73.0 in | Wt 270.0 lb

## 2017-11-21 DIAGNOSIS — B20 Human immunodeficiency virus [HIV] disease: Secondary | ICD-10-CM

## 2017-11-21 DIAGNOSIS — Z23 Encounter for immunization: Secondary | ICD-10-CM

## 2017-11-21 DIAGNOSIS — Z79899 Other long term (current) drug therapy: Secondary | ICD-10-CM

## 2017-11-21 NOTE — Progress Notes (Signed)
RFV: follow up for hiv disease  Patient ID: Eric [name], male   DOB: Dec 19, 1990, 27 y.o.   MRN: [id number]  HPI Eric [name] is a 27yo M with well controlled hiv disease on biktarvy. Doing well with adherence. Only missing 2 doses since we last saw him. He has not had any unprotected sex/ and protected sex has been > 6 mo.  Works in [employer]. Recovered from cold last week  Outpatient Encounter Medications as of 11/21/2017  Medication Sig  . acetaminophen (TYLENOL) 325 MG tablet Take 650 mg every 6 (six) hours as needed by mouth for mild pain.  Marland Kitchen BIKTARVY 50-200-25 MG TABS tablet TAKE 1 TABLET BY MOUTH DAILY AT THE SAME TIME EACH DAY WITH OR WITHOUT FOOD  . ibuprofen (ADVIL,MOTRIN) 200 MG tablet Take 200-800 mg every 6 (six) hours as needed by mouth for moderate pain.   No facility-administered encounter medications on file as of 11/21/2017.      Patient Active Problem List   Diagnosis Date Noted  . S/P thoracentesis   . Sepsis due to Streptococcus, group A (HCC)   . Bacteremia   . Multifocal pneumonia 12/21/2016  . AKI (acute kidney injury) (HCC) 12/21/2016  . Hypotension 12/21/2016  . HIV (human immunodeficiency virus infection) (HCC) 09/22/2016  . Healthcare maintenance 09/22/2016  . Depression 09/22/2016     Health Maintenance Due  Topic Date Due  . INFLUENZA VACCINE  09/08/2017    Social History   Tobacco Use  . Smoking status: Never Smoker  . Smokeless tobacco: Never Used  Substance Use Topics  . Alcohol use: Yes    Comment: social  . Drug use: No   Review of Systems Review of Systems  Constitutional: Negative for fever, chills, diaphoresis, activity change, appetite change, fatigue and unexpected weight change.  HENT: Negative for congestion, sore throat, rhinorrhea, sneezing, trouble swallowing and sinus pressure.  Eyes: Negative for photophobia and visual disturbance.  Respiratory: Negative for cough, chest tightness, shortness of breath,  wheezing and stridor.  Cardiovascular: Negative for chest pain, palpitations and leg swelling.  Gastrointestinal: Negative for nausea, vomiting, abdominal pain, diarrhea, constipation, blood in stool, abdominal distention and anal bleeding.  Genitourinary: Negative for dysuria, hematuria, flank pain and difficulty urinating.  Musculoskeletal: Negative for myalgias, back pain, joint swelling, arthralgias and gait problem.  Skin: Negative for color change, pallor, rash and wound.  Neurological: Negative for dizziness, tremors, weakness and light-headedness.  Hematological: Negative for adenopathy. Does not bruise/bleed easily.  Psychiatric/Behavioral: Negative for behavioral problems, confusion, sleep disturbance, dysphoric mood, decreased concentration and agitation.    Physical Exam   BP 127/80   Pulse 81   Temp 98.1 F (36.7 C)   Ht 6\' 1"  (1.854 m)   Wt 270 lb (122.5 kg)   BMI 35.62 kg/m   Physical Exam  Constitutional: He is oriented to person, place, and time. He appears well-developed and well-nourished. No distress.  HENT:  Mouth/Throat: Oropharynx is clear and moist. No oropharyngeal exudate.  Cardiovascular: Normal rate, regular rhythm and normal heart sounds. Exam reveals no gallop and no friction rub.  No murmur heard.  Pulmonary/Chest: Effort normal and breath sounds normal. No respiratory distress. He has no wheezes.  Lymphadenopathy:  He has no cervical adenopathy.  Neurological: He is alert and oriented to person, place, and time.  Skin: Skin is warm and dry. No rash noted. No erythema.  Psychiatric: He has a normal mood and affect. His behavior is normal.    Lab Results  Component Value Date   CD4TCELL 26 (L) 11/07/2017   Lab Results  Component Value Date   CD4TABS 620 11/07/2017   CD4TABS 330 (L) 03/24/2017   CD4TABS 470 12/22/2016   Lab Results  Component Value Date   HIV1RNAQUANT <20 NOT DETECTED 11/07/2017   Lab Results  Component Value Date    HEPBSAB NON-REACTIVE 09/22/2016   Lab Results  Component Value Date   LABRPR NON-REACTIVE 03/24/2017    CBC Lab Results  Component Value Date   WBC 3.6 (L) 11/07/2017   RBC 4.78 11/07/2017   HGB 15.3 11/07/2017   HCT 43.4 11/07/2017   PLT 266 11/07/2017   MCV 90.8 11/07/2017   MCH 32.0 11/07/2017   MCHC 35.3 11/07/2017   RDW 12.3 11/07/2017   LYMPHSABS 2,128 11/07/2017   MONOABS 1.4 (H) 12/20/2016   EOSABS 130 11/07/2017    BMET Lab Results  Component Value Date   NA 134 (L) 11/07/2017   K 4.2 11/07/2017   CL 101 11/07/2017   CO2 24 11/07/2017   GLUCOSE 89 11/07/2017   BUN 10 11/07/2017   CREATININE 1.12 11/07/2017   CALCIUM 9.1 11/07/2017   GFRNONAA 91 11/07/2017   GFRAA 106 11/07/2017      Assessment and Plan  hiv disease- well-controlled, plan to continue on current regimen  Long term medication management = cr is stable  Health maintenance = will give prevnar and flu today  rtc in 6 mo

## 2017-11-21 NOTE — Progress Notes (Signed)
Prevnar and Flu vaccines administered today. Patient tolerated well. Patient to follow up with Dr. Drue Second in 6 months with labs 2 weeks before office visit.  S.Genette Huertas, LPN

## 2017-12-21 DIAGNOSIS — Q150 Congenital glaucoma: Secondary | ICD-10-CM | POA: Diagnosis not present

## 2017-12-21 DIAGNOSIS — H33311 Horseshoe tear of retina without detachment, right eye: Secondary | ICD-10-CM | POA: Diagnosis not present

## 2017-12-21 DIAGNOSIS — H53021 Refractive amblyopia, right eye: Secondary | ICD-10-CM | POA: Diagnosis not present

## 2017-12-21 DIAGNOSIS — H5213 Myopia, bilateral: Secondary | ICD-10-CM | POA: Diagnosis not present

## 2017-12-22 ENCOUNTER — Ambulatory Visit (INDEPENDENT_AMBULATORY_CARE_PROVIDER_SITE_OTHER): Payer: BLUE CROSS/BLUE SHIELD | Admitting: Ophthalmology

## 2017-12-22 ENCOUNTER — Encounter (INDEPENDENT_AMBULATORY_CARE_PROVIDER_SITE_OTHER): Payer: Self-pay | Admitting: Ophthalmology

## 2017-12-22 DIAGNOSIS — H33321 Round hole, right eye: Secondary | ICD-10-CM

## 2017-12-22 DIAGNOSIS — Q150 Congenital glaucoma: Secondary | ICD-10-CM

## 2017-12-22 DIAGNOSIS — B20 Human immunodeficiency virus [HIV] disease: Secondary | ICD-10-CM | POA: Diagnosis not present

## 2017-12-22 DIAGNOSIS — Z21 Asymptomatic human immunodeficiency virus [HIV] infection status: Secondary | ICD-10-CM

## 2017-12-22 DIAGNOSIS — H5213 Myopia, bilateral: Secondary | ICD-10-CM

## 2017-12-22 DIAGNOSIS — H3581 Retinal edema: Secondary | ICD-10-CM

## 2017-12-22 DIAGNOSIS — H52203 Unspecified astigmatism, bilateral: Secondary | ICD-10-CM

## 2017-12-22 MED ORDER — PREDNISOLONE ACETATE 1 % OP SUSP: 1.0000 [drp] | Freq: Four times a day (QID) | OPHTHALMIC | 0 refills | Status: AC

## 2017-12-22 NOTE — Progress Notes (Addendum)
[facility] - Clinic Note  12/22/2017     CHIEF COMPLAINT Patient presents for Retina Evaluation   HISTORY OF PRESENT ILLNESS: Eric [name] is a 27 y.o. male who presents to the clinic today for:   HPI    Retina Evaluation    In right eye.  Onset: unknown.  Duration: unknown.  Associated Symptoms Floaters.  Negative for Flashes, Blind Spot, Photophobia, Scalp Tenderness, Fever, Pain, Glare, Jaw Claudication, Weight Loss, Distortion, Redness, Trauma, Shoulder/Hip pain and Fatigue.  Context: Patients sees well with glasses.  Treatments tried include no treatments.  I, the attending physician,  performed the HPI with the patient and updated documentation appropriately.          Comments    Patient states referred by Dr. Jorje Guild for eval of possible retinal hole OD. Patient states he has had floaters OU for a long time. No new symptoms, no flashes. Was in office to see Dr. Jorje Guild for routine eye exam. Patient diagnosed with HIV last year.        Last edited by Rennis Chris, MD on 12/23/2017  1:09 PM. (History)     Patient denies floaters and FOL. Saw Dr. Jorje Guild for routine eye exam.   Referring physician: Elayne Guerin, MD [address] [place], [place] [address]  HISTORICAL INFORMATION:   Selected notes from the MEDICAL RECORD NUMBER Referred by Dr. Council Mechanic for possible retinal hole OD Tavian: 11.13.19 (K. Hallahan) [BCVA: OD: 20/30++ OS: 20/25] Ocular Hx-congenital glaucoma PMH-HIV, depression    CURRENT MEDICATIONS: Current Outpatient Medications (Ophthalmic Drugs)  Medication Sig  . prednisoLONE acetate (PRED FORTE) 1 % ophthalmic suspension Place 1 drop into the right eye 4 (four) times daily for 7 days.   No current facility-administered medications for this visit.  (Ophthalmic Drugs)   Current Outpatient Medications (Other)  Medication Sig  . acetaminophen (TYLENOL) 325 MG tablet Take 650 mg every 6 (six) hours as  needed by mouth for mild pain.  Marland Kitchen BIKTARVY 50-200-25 MG TABS tablet TAKE 1 TABLET BY MOUTH DAILY AT THE SAME TIME EACH DAY WITH OR WITHOUT FOOD  . ibuprofen (ADVIL,MOTRIN) 200 MG tablet Take 200-800 mg every 6 (six) hours as needed by mouth for moderate pain.   No current facility-administered medications for this visit.  (Other)      REVIEW OF SYSTEMS: ROS    Positive for: Eyes   Negative for: Constitutional, Gastrointestinal, Neurological, Skin, Genitourinary, Musculoskeletal, HENT, Endocrine, Cardiovascular, Respiratory, Psychiatric, Allergic/Imm, Heme/Lymph   Last edited by Annalee Genta D on 12/22/2017  9:44 AM. (History)       ALLERGIES No Known Allergies  PAST MEDICAL HISTORY Past Medical History:  Diagnosis Date  . Depression 09/22/2016  . Glaucoma    currently untreated   . HIV (human immunodeficiency virus infection) (HCC) 09/22/2016   Past Surgical History:  Procedure Laterality Date  . NO PAST SURGERIES      FAMILY HISTORY Family History  Problem Relation Age of Onset  . Hypertension Father   . Diabetes Father   . Healthy Brother   . Hypertension Maternal Grandmother   . Kidney disease Maternal Grandmother   . Glaucoma Cousin     SOCIAL HISTORY Social History   Tobacco Use  . Smoking status: Never Smoker  . Smokeless tobacco: Never Used  Substance Use Topics  . Alcohol use: Yes    Comment: social  . Drug use: No         OPHTHALMIC  EXAM:  Base Eye Exam    Visual Acuity (Snellen - Linear)      Right Left   Dist cc 20/30 +2 20/25 +1   Dist ph cc 20/25 NI       Tonometry (Tonopen, 9:56 AM)      Right Left   Pressure 17 14       Pupils      Dark Light Shape React APD   Right 4 3 Round Slow None   Left 5 4 Round Slow None       Visual Fields (Counting fingers)      Left Right    Full Full       Extraocular Movement      Right Left    Full, Ortho Full, Ortho       Neuro/Psych    Oriented x3:  Yes   Mood/Affect:  Normal        Dilation    Both eyes:  1.0% Mydriacyl, 2.5% Phenylephrine @ 9:56 AM        Slit Lamp and Fundus Exam    Slit Lamp Exam      Right Left   Lids/Lashes mild Meibomian gland dysfunction mild Meibomian gland dysfunction   Conjunctiva/Sclera White and quiet, mild melanosis White and quiet, mild melanosis   Cornea Clear Clear   Anterior Chamber Deep and quiet Deep and quiet   Iris Round and reactive, well dilated Round and reactive, well dilated   Lens Clear Clear   Vitreous trace , Vitreous syneresis trace , Vitreous syneresis       Fundus Exam      Right Left   Disc cupping, pallor cupping, pallor   C/D Ratio 0.95 0.95   Macula flat , good foveal reflex flat , good foveal reflex   Vessels Normal Normal   Periphery retinal hole @ 7:00, so SRF attached; white without pressure inferior        Refraction    Wearing Rx      Sphere Cylinder Axis   Right -7.00 +3.50 100   Left -6.50 +3.25 090   Age:  4 yr   Type:  SVL       Manifest Refraction (Over)      Sphere Cylinder Axis Dist VA   Right -7.50 +3.50 092 20/20   Left -6.50 +3.50 100 20/20-2          IMAGING AND PROCEDURES  Imaging and Procedures for @TODAY @  OCT, Retina - OU - Both Eyes       Right Eye Central Foveal Thickness: 254. Progression has no prior data. Findings include no IRF, normal foveal contour, no SRF.   Left Eye Central Foveal Thickness: 265. Progression has no prior data. Findings include normal foveal contour, no SRF, no IRF.   Notes *Images captured and stored on drive  Diagnosis / Impression:  NFP; no IRF/SRF OU  Clinical management:  See below  Abbreviations: NFP - Normal foveal profile. CME - cystoid macular edema. PED - pigment epithelial detachment. IRF - intraretinal fluid. SRF - subretinal fluid. EZ - ellipsoid zone. ERM - epiretinal membrane. ORA - outer retinal atrophy. ORT - outer retinal tubulation. SRHM - subretinal hyper-reflective material        Repair Retinal  Breaks, Laser - OD - Right Eye       LASER PROCEDURE NOTE  Procedure:  Barrier laser retinopexy using slit lamp laser, RIGHT eye   Diagnosis:   Retinal hole, RIGHT eye  7 o'clock anterior to equator   Surgeon: Rennis ChrisBrian Montoya Watkin, MD, PhD  Anesthesia: Topical  Informed consent obtained, operative eye marked, and time out performed prior to initiation of laser.   Laser settings:  Lumenis Smart532 laser, slit lamp Lens: Mainster PRP 165 Power: 240 mW Spot size: 200 microns Duration: 30 msec  # spots: 157  Placement of laser: Using a Mainster PRP 165 contact lens at the slit lamp, laser was placed in three confluent rows around retinal hole at 7 oclock anterior to equator with additional rows anteriorly.  Complications: None.  Patient tolerated the procedure well and received written and verbal post-procedure care information/education.                  ASSESSMENT/PLAN:    ICD-10-CM   1. 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 Retinal edema H35.81 OCT, Retina - OU - Both Eyes  3. HIV infection, unspecified symptom status (HCC) B20   4. 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 Retinal hole OD - The incidence, risk factors, and natural history of retinal tear was discussed with patient.   - Potential treatment options including laser retinopexy and cryotherapy discussed with patient. - small hole located at 0700, anterior to equator, no SRF or traction - recommend laser retinopexy OD today, 11.14.19 - RBA of procedure discussed, questions answered - informed consent obtained and signed - see procedure note - start PF QID OD x7 days - f/u in 2-3 wks  2. No retinal edema on exam or OCT  3. HIV without retinopathy - currently on Biktarvy -- doing well - HIV-1 RNA not detected and CD4 count 620 on 9.30 19  4. Myopia w/ astigmatism OU - under the expert  management of Dr. Jorje GuildHallahan  5. 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 Loraine GripMoya for further glaucoma evaluation and management   Ophthalmic Meds Ordered this visit:  Meds ordered this encounter  Medications  . prednisoLONE acetate (PRED FORTE) 1 % ophthalmic suspension    Sig: Place 1 drop into the right eye 4 (four) times daily for 7 days.    Dispense:  10 mL    Refill:  0       Return for 2-3 wks, POV -- s/p laser retinopexy OD.  There are no Patient Instructions on file for this visit.   Explained the diagnoses, plan, and follow up with the patient and they expressed understanding.  Patient expressed understanding of the importance of proper follow up care.   This document serves as a record of services personally performed by Karie ChimeraBrian G. Mee Macdonnell, MD, PhD. It was created on their behalf by Laurian BrimAmanda Brown, OA, an ophthalmic assistant. The creation of this record is the provider's dictation and/or activities during the visit.    Electronically signed by: Laurian BrimAmanda Brown, OA 11.14.19 1:06 AM    Karie ChimeraBrian G. Ariana Juul, M.D., Ph.D. Diseases & Surgery of the Retina and Vitreous [facility]  I have reviewed the above documentation for accuracy and completeness, and I agree with the above. Karie ChimeraBrian G. Avira Tillison, M.D., Ph.D. 12/26/17 1:06 AM     Abbreviations: M myopia (nearsighted); A astigmatism; H hyperopia (farsighted); P presbyopia; Mrx spectacle prescription;  CTL contact lenses; OD right eye; OS left eye; OU both eyes  XT  exotropia; ET esotropia; PEK punctate epithelial keratitis; PEE punctate epithelial erosions; DES dry eye syndrome; MGD meibomian gland dysfunction; ATs artificial tears; PFAT's preservative free artificial tears; NSC nuclear sclerotic cataract; PSC posterior subcapsular cataract; ERM epi-retinal membrane; PVD posterior vitreous detachment; RD retinal detachment; DM diabetes mellitus; DR diabetic  retinopathy; NPDR non-proliferative diabetic retinopathy; PDR proliferative diabetic retinopathy; CSME clinically significant macular edema; DME diabetic macular edema; dbh dot blot hemorrhages; CWS cotton wool spot; POAG primary open angle glaucoma; C/D cup-to-disc ratio; HVF humphrey visual field; GVF goldmann visual field; OCT optical coherence tomography; IOP intraocular pressure; BRVO Branch retinal vein occlusion; CRVO central retinal vein occlusion; CRAO central retinal artery occlusion; BRAO branch retinal artery occlusion; RT retinal tear; SB scleral buckle; PPV pars plana vitrectomy; VH Vitreous hemorrhage; PRP panretinal laser photocoagulation; IVK intravitreal kenalog; VMT vitreomacular traction; MH Macular hole;  NVD neovascularization of the disc; NVE neovascularization elsewhere; AREDS age related eye disease study; ARMD age related macular degeneration; POAG primary open angle glaucoma; EBMD epithelial/anterior basement membrane dystrophy; ACIOL anterior chamber intraocular lens; IOL intraocular lens; PCIOL posterior chamber intraocular lens; Phaco/IOL phacoemulsification with intraocular lens placement; PRK photorefractive keratectomy; LASIK laser assisted in situ keratomileusis; HTN hypertension; DM diabetes mellitus; COPD chronic obstructive pulmonary disease

## 2017-12-26 ENCOUNTER — Encounter (INDEPENDENT_AMBULATORY_CARE_PROVIDER_SITE_OTHER): Payer: Self-pay | Admitting: Ophthalmology

## 2018-01-02 NOTE — Progress Notes (Signed)
[facility] - Clinic Note  01/04/2018     CHIEF COMPLAINT Patient presents for Post-op Follow-up   HISTORY OF PRESENT ILLNESS: Eric [name] is a 27 y.o. male who presents to the clinic today for:   HPI    Post-op Follow-up    In right eye.  Discomfort includes Negative for pain, itching, foreign body sensation, tearing, discharge, floaters and none.  Vision is stable.  I, the attending physician,  performed the HPI with the patient and updated documentation appropriately.          Comments    Patient states vision the same OU. No new floaters or flashes.       Last edited by Rennis ChrisZamora, Kesha Hurrell, MD on 01/05/2018  1:18 PM. (History)    pt states he is doing well after laser, he states he used the gtts as directed, pt has an appt with Dr. Loraine GripMoya on February 10, 2018, pt denies new floaters or FOL  Referring physician: Bing NeighborsHarris, Kimberly S, FNP [address] [place], [place] [id number]  HISTORICAL INFORMATION:   Selected notes from the MEDICAL RECORD NUMBER Referred by Dr. Council MechanicKatie Hallahan for possible retinal hole OD Hersh: 11.13.19 (K. Hallahan) [BCVA: OD: 20/30++ OS: 20/25] Ocular Hx-congenital glaucoma PMH-HIV, depression    CURRENT MEDICATIONS: No current outpatient medications on file. (Ophthalmic Drugs)   No current facility-administered medications for this visit.  (Ophthalmic Drugs)   Current Outpatient Medications (Other)  Medication Sig  . acetaminophen (TYLENOL) 325 MG tablet Take 650 mg every 6 (six) hours as needed by mouth for mild pain.  Marland Kitchen. BIKTARVY 50-200-25 MG TABS tablet TAKE 1 TABLET BY MOUTH DAILY AT THE SAME TIME EACH DAY WITH OR WITHOUT FOOD  . ibuprofen (ADVIL,MOTRIN) 200 MG tablet Take 200-800 mg every 6 (six) hours as needed by mouth for moderate pain.   No current facility-administered medications for this visit.  (Other)      REVIEW OF SYSTEMS: ROS    Positive for: Eyes   Negative for: Constitutional, Gastrointestinal,  Neurological, Skin, Genitourinary, Musculoskeletal, HENT, Endocrine, Cardiovascular, Respiratory, Psychiatric, Allergic/Imm, Heme/Lymph   Last edited by Annalee GentaBarber, Daryl D on 01/04/2018 10:17 AM. (History)       ALLERGIES No Known Allergies  PAST MEDICAL HISTORY Past Medical History:  Diagnosis Date  . Depression 09/22/2016  . Glaucoma    currently untreated   . HIV (human immunodeficiency virus infection) (HCC) 09/22/2016   Past Surgical History:  Procedure Laterality Date  . NO PAST SURGERIES      FAMILY HISTORY Family History  Problem Relation Age of Onset  . Hypertension Father   . Diabetes Father   . Healthy Brother   . Hypertension Maternal Grandmother   . Kidney disease Maternal Grandmother   . Glaucoma Cousin     SOCIAL HISTORY Social History   Tobacco Use  . Smoking status: Never Smoker  . Smokeless tobacco: Never Used  Substance Use Topics  . Alcohol use: Yes    Comment: social  . Drug use: No         OPHTHALMIC EXAM:  Base Eye Exam    Visual Acuity (Snellen - Linear)      Right Left   Dist cc 20/25 20/20 -1   Dist ph cc 20/20 -2    Correction:  Glasses       Tonometry (Tonopen, 10:23 AM)      Right Left   Pressure 16        Pupils  Dark Light Shape React APD   Right 4 3 Round Slow None   Left 5 4 Round Slow None       Visual Fields (Counting fingers)      Left Right    Full Full       Extraocular Movement      Right Left    Full, Ortho Full, Ortho       Neuro/Psych    Oriented x3:  Yes   Mood/Affect:  Normal       Dilation    Right eye:  1.0% Mydriacyl, 2.5% Phenylephrine @ 10:23 AM        Slit Lamp and Fundus Exam    Slit Lamp Exam      Right Left   Lids/Lashes mild Meibomian gland dysfunction mild Meibomian gland dysfunction   Conjunctiva/Sclera White and quiet, mild melanosis White and quiet, mild melanosis   Cornea Clear Clear   Anterior Chamber Deep and quiet Deep and quiet   Iris Round and reactive, well  dilated Round and reactive, well dilated   Lens Clear Clear   Vitreous trace , Vitreous syneresis trace , Vitreous syneresis       Fundus Exam      Right Left   Disc cupping, pallor cupping, pallor   C/D Ratio 0.95 0.95   Macula flat , good foveal reflex flat , good foveal reflex   Vessels Normal Normal   Periphery retinal hole @ 7:00, no SRF -- good early laser changes surrounding attached; white without pressure inferior        Refraction    Wearing Rx      Sphere Cylinder Axis   Right -7.00 +3.50 100   Left -6.50 +3.25 090   Type:  SVL          IMAGING AND PROCEDURES  Imaging and Procedures for @TODAY @           ASSESSMENT/PLAN:    ICD-10-CM   1. Retinal hole, right H33.321   2. Retinal edema H35.81   3. HIV infection, unspecified symptom status (HCC) B20   4. 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 Retinal hole OD - small hole located at 0700, anterior to equator, no SRF or traction - s/p laser retinopexy OD (11.14.19) -- good early laser changes - finished PF x7 days - f/u 3 months  2. No retinal edema on exam or OCT  3. HIV without retinopathy - currently on Biktarvy -- doing well - HIV-1 RNA not detected and CD4 count 620 on 9.30 19  4. Myopia w/ astigmatism OU - under the expert management of Dr. Jorje Guild  5. 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 Loraine Grip for further glaucoma evaluation and management -- scheduled for Jan   Ophthalmic Meds Ordered this visit:  No orders of the defined types were placed in this encounter.      Return in about 3 months (around 04/06/2018) for F/U retinal hole OD, DFE, OCT.  There are no Patient Instructions on file for this visit.   Explained the diagnoses, plan, and follow up with the patient and they expressed understanding.  Patient expressed understanding of the importance of proper follow  up care.   This document serves as a record of services personally performed by Karie Chimera, MD, PhD. It was created on their  behalf by Laurian Brim, OA, an ophthalmic assistant. The creation of this record is the provider's dictation and/or activities during the visit.    Electronically signed by: Laurian Brim, OA  11.25.19 1:22 PM    Karie Chimera, M.D., Ph.D. Diseases & Surgery of the Retina and Vitreous [facility]  I have reviewed the above documentation for accuracy and completeness, and I agree with the above. Karie Chimera, M.D., Ph.D. 01/05/18 1:24 PM    Abbreviations: M myopia (nearsighted); A astigmatism; H hyperopia (farsighted); P presbyopia; Mrx spectacle prescription;  CTL contact lenses; OD right eye; OS left eye; OU both eyes  XT exotropia; ET esotropia; PEK punctate epithelial keratitis; PEE punctate epithelial erosions; DES dry eye syndrome; MGD meibomian gland dysfunction; ATs artificial tears; PFAT's preservative free artificial tears; NSC nuclear sclerotic cataract; PSC posterior subcapsular cataract; ERM epi-retinal membrane; PVD posterior vitreous detachment; RD retinal detachment; DM diabetes mellitus; DR diabetic retinopathy; NPDR non-proliferative diabetic retinopathy; PDR proliferative diabetic retinopathy; CSME clinically significant macular edema; DME diabetic macular edema; dbh dot blot hemorrhages; CWS cotton wool spot; POAG primary open angle glaucoma; C/D cup-to-disc ratio; HVF humphrey visual field; GVF goldmann visual field; OCT optical coherence tomography; IOP intraocular pressure; BRVO Branch retinal vein occlusion; CRVO central retinal vein occlusion; CRAO central retinal artery occlusion; BRAO branch retinal artery occlusion; RT retinal tear; SB scleral buckle; PPV pars plana vitrectomy; VH Vitreous hemorrhage; PRP panretinal laser photocoagulation; IVK intravitreal kenalog; VMT vitreomacular traction; MH Macular hole;  NVD  neovascularization of the disc; NVE neovascularization elsewhere; AREDS age related eye disease study; ARMD age related macular degeneration; POAG primary open angle glaucoma; EBMD epithelial/anterior basement membrane dystrophy; ACIOL anterior chamber intraocular lens; IOL intraocular lens; PCIOL posterior chamber intraocular lens; Phaco/IOL phacoemulsification with intraocular lens placement; PRK photorefractive keratectomy; LASIK laser assisted in situ keratomileusis; HTN hypertension; DM diabetes mellitus; COPD chronic obstructive pulmonary disease

## 2018-01-04 ENCOUNTER — Encounter (INDEPENDENT_AMBULATORY_CARE_PROVIDER_SITE_OTHER): Payer: Self-pay | Admitting: Ophthalmology

## 2018-01-04 ENCOUNTER — Ambulatory Visit (INDEPENDENT_AMBULATORY_CARE_PROVIDER_SITE_OTHER): Payer: BLUE CROSS/BLUE SHIELD | Admitting: Ophthalmology

## 2018-01-04 DIAGNOSIS — H5213 Myopia, bilateral: Secondary | ICD-10-CM

## 2018-01-04 DIAGNOSIS — H52203 Unspecified astigmatism, bilateral: Secondary | ICD-10-CM

## 2018-01-04 DIAGNOSIS — B20 Human immunodeficiency virus [HIV] disease: Secondary | ICD-10-CM

## 2018-01-04 DIAGNOSIS — Q150 Congenital glaucoma: Secondary | ICD-10-CM

## 2018-01-04 DIAGNOSIS — H3581 Retinal edema: Secondary | ICD-10-CM

## 2018-01-04 DIAGNOSIS — H33321 Round hole, right eye: Secondary | ICD-10-CM

## 2018-01-05 ENCOUNTER — Encounter (INDEPENDENT_AMBULATORY_CARE_PROVIDER_SITE_OTHER): Payer: Self-pay | Admitting: Ophthalmology

## 2018-02-14 DIAGNOSIS — B20 Human immunodeficiency virus [HIV] disease: Secondary | ICD-10-CM

## 2018-02-14 MED ORDER — BICTEGRAVIR-EMTRICITAB-TENOFOV 50-200-25 MG PO TABS: ORAL_TABLET | ORAL | 5 refills | Status: DC

## 2018-02-14 NOTE — Addendum Note (Signed)
Addended by: Andree Coss on: 02/14/2018 04:38 PM   Modules accepted: Orders

## 2018-03-31 ENCOUNTER — Telehealth (INDEPENDENT_AMBULATORY_CARE_PROVIDER_SITE_OTHER): Payer: Self-pay

## 2018-04-06 NOTE — Progress Notes (Signed)
[facility] - Clinic Note  04/07/2018     CHIEF COMPLAINT Patient presents for Retina Follow Up   HISTORY OF PRESENT ILLNESS: Eric [name] is a 28 y.o. male who presents to the clinic today for:   HPI    Retina Follow Up    Patient presents with  Retinal Break/Detachment.  In right eye.  Severity is moderate.  Duration of 3 months.  Since onset it is stable.  I, the attending physician,  performed the HPI with the patient and updated documentation appropriately.          Comments    Patient states vision the same OU. Frequent tearing starting about 1 month ago. Last week tearing was worse, but seems better this week. No light sensitivity.        Last edited by Rennis Chris, MD on 04/07/2018 10:54 AM. (History)     Patient c/o watering especially last week, but better now.   Referring physician: Bing Neighbors, FNP [address] [place], [place] [address]  HISTORICAL INFORMATION:   Selected notes from the MEDICAL RECORD NUMBER Referred by Dr. Council Mechanic for possible retinal hole OD Rodman: 11.13.19 (K. Hallahan) [BCVA: OD: 20/30++ OS: 20/25] Ocular Hx-congenital glaucoma PMH-HIV, depression    CURRENT MEDICATIONS: No current outpatient medications on file. (Ophthalmic Drugs)   No current facility-administered medications for this visit.  (Ophthalmic Drugs)   Current Outpatient Medications (Other)  Medication Sig  . acetaminophen (TYLENOL) 325 MG tablet Take 650 mg every 6 (six) hours as needed by mouth for mild pain.  . bictegravir-emtricitabine-tenofovir AF (BIKTARVY) 50-200-25 MG TABS tablet TAKE 1 TABLET BY MOUTH DAILY AT THE SAME TIME EACH DAY WITH OR WITHOUT FOOD  . ibuprofen (ADVIL,MOTRIN) 200 MG tablet Take 200-800 mg every 6 (six) hours as needed by mouth for moderate pain.   No current facility-administered medications for this visit.  (Other)      REVIEW OF SYSTEMS: ROS    Positive for: Eyes   Negative for:  Constitutional, Gastrointestinal, Neurological, Skin, Genitourinary, Musculoskeletal, HENT, Endocrine, Cardiovascular, Respiratory, Psychiatric, Allergic/Imm, Heme/Lymph   Last edited by Annalee Genta D on 04/07/2018 10:13 AM. (History)       ALLERGIES No Known Allergies  PAST MEDICAL HISTORY Past Medical History:  Diagnosis Date  . Depression 09/22/2016  . Glaucoma    currently untreated   . HIV (human immunodeficiency virus infection) (HCC) 09/22/2016   Past Surgical History:  Procedure Laterality Date  . NO PAST SURGERIES      FAMILY HISTORY Family History  Problem Relation Age of Onset  . Hypertension Father   . Diabetes Father   . Healthy Brother   . Hypertension Maternal Grandmother   . Kidney disease Maternal Grandmother   . Glaucoma Cousin     SOCIAL HISTORY Social History   Tobacco Use  . Smoking status: Never Smoker  . Smokeless tobacco: Never Used  Substance Use Topics  . Alcohol use: Yes    Comment: social  . Drug use: No         OPHTHALMIC EXAM:  Base Eye Exam    Visual Acuity (Snellen - Linear)      Right Left   Dist cc 20/25 20/20   Dist ph cc 20/20 -1    Correction:  Glasses       Tonometry (Tonopen, 10:23 AM)      Right Left   Pressure 19 17       Pupils  Dark Light Shape React APD   Right 4 3 Round Slow None   Left 5 4 Round Slow None       Visual Fields (Counting fingers)      Left Right    Full Full       Extraocular Movement      Right Left    Full, Ortho Full, Ortho       Neuro/Psych    Oriented x3:  Yes   Mood/Affect:  Normal       Dilation    Both eyes:  1.0% Mydriacyl, 2.5% Phenylephrine @ 10:23 AM        Slit Lamp and Fundus Exam    Slit Lamp Exam      Right Left   Lids/Lashes mild Meibomian gland dysfunction mild Meibomian gland dysfunction   Conjunctiva/Sclera White and quiet, mild melanosis White and quiet, mild melanosis   Cornea Clear, mild tear film debris, trace PEE Clear, trace tear film  debris, trace PEE   Anterior Chamber Deep and quiet Deep and quiet   Iris Round and reactive, well dilated Round and reactive, well dilated   Lens Clear Clear   Vitreous trace , Vitreous syneresis trace , Vitreous syneresis       Fundus Exam      Right Left   Disc cupping, pallor cupping, pallor   C/D Ratio 0.95 0.95   Macula flat , good foveal reflex flat , good foveal reflex   Vessels Normal Normal   Periphery retinal hole @ 7:00, no SRF -- good laser changes surrounding attached; white without pressure inferior        Refraction    Wearing Rx      Sphere Cylinder Axis   Right -7.00 +3.50 100   Left -6.50 +3.25 090   Type:  SVL          IMAGING AND PROCEDURES  Imaging and Procedures for @TODAY @  OCT, Retina - OU - Both Eyes       Right Eye Quality was good. Central Foveal Thickness: 259. Progression has been stable. Findings include no IRF, normal foveal contour, no SRF.   Left Eye Quality was good. Central Foveal Thickness: 263. Progression has been stable. Findings include normal foveal contour, no SRF, no IRF.   Notes *Images captured and stored on drive  Diagnosis / Impression:  NFP; no IRF/SRF OU  Clinical management:  See below  Abbreviations: NFP - Normal foveal profile. CME - cystoid macular edema. PED - pigment epithelial detachment. IRF - intraretinal fluid. SRF - subretinal fluid. EZ - ellipsoid zone. ERM - epiretinal membrane. ORA - outer retinal atrophy. ORT - outer retinal tubulation. SRHM - subretinal hyper-reflective material                 ASSESSMENT/PLAN:    ICD-10-CM   1. Retinal hole, right H33.321   2. Retinal edema H35.81 OCT, Retina - OU - Both Eyes  3. HIV infection, unspecified symptom status (HCC) B20   4. 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 Retinal hole OD - small hole located at 0700, anterior to equator, no SRF or traction - s/p laser retinopexy OD  (11.14.19) -- good laser changes - f/u here prn,  - recommend continued ophthalmology f/u at [facility]  2. No retinal edema on exam or OCT  3. HIV without retinopathy - currently on Biktarvy -- doing well -  HIV-1 RNA not detected and CD4 count 620 on 9.30 19  4. Myopia w/ astigmatism OU - under the expert management of [facility]  5. 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 Loraine Grip for further glaucoma evaluation and management -- scheduled for next week/ R/S from January   Ophthalmic Meds Ordered this visit:  No orders of the defined types were placed in this encounter.      Return if symptoms worsen or fail to improve.  There are no Patient Instructions on file for this visit.   Explained the diagnoses, plan, and follow up with the patient and they expressed understanding.  Patient expressed understanding of the importance of proper follow up care.   This document serves as a record of services personally performed by Karie Chimera, MD, PhD. It was created on their behalf by Laurian Brim, OA, an ophthalmic assistant. The creation of this record is the provider's dictation and/or activities during the visit.    Electronically signed by: Laurian Brim, OA  02.27.2020 1:43 PM     Karie Chimera, M.D., Ph.D. Diseases & Surgery of the Retina and Vitreous [facility]  I have reviewed the above documentation for accuracy and completeness, and I agree with the above. Karie Chimera, M.D., Ph.D. 04/07/18 1:43 PM     Abbreviations: M myopia (nearsighted); A astigmatism; H hyperopia (farsighted); P presbyopia; Mrx spectacle prescription;  CTL contact lenses; OD right eye; OS left eye; OU both eyes  XT exotropia; ET esotropia; PEK punctate epithelial keratitis; PEE punctate epithelial erosions; DES dry eye syndrome; MGD meibomian gland dysfunction; ATs artificial tears; PFAT's  preservative free artificial tears; NSC nuclear sclerotic cataract; PSC posterior subcapsular cataract; ERM epi-retinal membrane; PVD posterior vitreous detachment; RD retinal detachment; DM diabetes mellitus; DR diabetic retinopathy; NPDR non-proliferative diabetic retinopathy; PDR proliferative diabetic retinopathy; CSME clinically significant macular edema; DME diabetic macular edema; dbh dot blot hemorrhages; CWS cotton wool spot; POAG primary open angle glaucoma; C/D cup-to-disc ratio; HVF humphrey visual field; GVF goldmann visual field; OCT optical coherence tomography; IOP intraocular pressure; BRVO Branch retinal vein occlusion; CRVO central retinal vein occlusion; CRAO central retinal artery occlusion; BRAO branch retinal artery occlusion; RT retinal tear; SB scleral buckle; PPV pars plana vitrectomy; VH Vitreous hemorrhage; PRP panretinal laser photocoagulation; IVK intravitreal kenalog; VMT vitreomacular traction; MH Macular hole;  NVD neovascularization of the disc; NVE neovascularization elsewhere; AREDS age related eye disease study; ARMD age related macular degeneration; POAG primary open angle glaucoma; EBMD epithelial/anterior basement membrane dystrophy; ACIOL anterior chamber intraocular lens; IOL intraocular lens; PCIOL posterior chamber intraocular lens; Phaco/IOL phacoemulsification with intraocular lens placement; PRK photorefractive keratectomy; LASIK laser assisted in situ keratomileusis; HTN hypertension; DM diabetes mellitus; COPD chronic obstructive pulmonary disease

## 2018-04-07 ENCOUNTER — Encounter (INDEPENDENT_AMBULATORY_CARE_PROVIDER_SITE_OTHER): Payer: Self-pay | Admitting: Ophthalmology

## 2018-04-07 ENCOUNTER — Ambulatory Visit (INDEPENDENT_AMBULATORY_CARE_PROVIDER_SITE_OTHER): Payer: BLUE CROSS/BLUE SHIELD | Admitting: Ophthalmology

## 2018-04-07 DIAGNOSIS — H52203 Unspecified astigmatism, bilateral: Secondary | ICD-10-CM

## 2018-04-07 DIAGNOSIS — B20 Human immunodeficiency virus [HIV] disease: Secondary | ICD-10-CM | POA: Diagnosis not present

## 2018-04-07 DIAGNOSIS — Q150 Congenital glaucoma: Secondary | ICD-10-CM

## 2018-04-07 DIAGNOSIS — H5213 Myopia, bilateral: Secondary | ICD-10-CM | POA: Diagnosis not present

## 2018-04-07 DIAGNOSIS — H33321 Round hole, right eye: Secondary | ICD-10-CM | POA: Diagnosis not present

## 2018-04-07 DIAGNOSIS — H3581 Retinal edema: Secondary | ICD-10-CM

## 2018-04-12 DIAGNOSIS — Q150 Congenital glaucoma: Secondary | ICD-10-CM | POA: Diagnosis not present

## 2018-04-12 DIAGNOSIS — H40023 Open angle with borderline findings, high risk, bilateral: Secondary | ICD-10-CM | POA: Diagnosis not present

## 2018-05-10 ENCOUNTER — Other Ambulatory Visit: Payer: Self-pay

## 2018-05-10 DIAGNOSIS — Z79899 Other long term (current) drug therapy: Secondary | ICD-10-CM

## 2018-05-10 DIAGNOSIS — Z113 Encounter for screening for infections with a predominantly sexual mode of transmission: Secondary | ICD-10-CM

## 2018-05-10 DIAGNOSIS — B20 Human immunodeficiency virus [HIV] disease: Secondary | ICD-10-CM

## 2018-05-16 ENCOUNTER — Other Ambulatory Visit: Payer: Self-pay

## 2018-05-30 ENCOUNTER — Encounter: Payer: Self-pay | Admitting: Internal Medicine

## 2018-07-17 ENCOUNTER — Other Ambulatory Visit: Payer: Self-pay

## 2018-07-31 DIAGNOSIS — S90111A Contusion of right great toe without damage to nail, initial encounter: Secondary | ICD-10-CM | POA: Diagnosis not present

## 2018-07-31 DIAGNOSIS — M79674 Pain in right toe(s): Secondary | ICD-10-CM | POA: Diagnosis not present

## 2018-07-31 DIAGNOSIS — S92911A Unspecified fracture of right toe(s), initial encounter for closed fracture: Secondary | ICD-10-CM | POA: Diagnosis not present

## 2018-08-03 DIAGNOSIS — S92424A Nondisplaced fracture of distal phalanx of right great toe, initial encounter for closed fracture: Secondary | ICD-10-CM | POA: Diagnosis not present

## 2018-08-09 ENCOUNTER — Encounter: Payer: Self-pay | Admitting: Internal Medicine

## 2018-08-31 ENCOUNTER — Other Ambulatory Visit: Payer: Self-pay | Admitting: Internal Medicine

## 2018-08-31 DIAGNOSIS — B20 Human immunodeficiency virus [HIV] disease: Secondary | ICD-10-CM

## 2018-09-29 ENCOUNTER — Other Ambulatory Visit: Payer: Self-pay

## 2018-09-29 DIAGNOSIS — B20 Human immunodeficiency virus [HIV] disease: Secondary | ICD-10-CM

## 2018-09-29 MED ORDER — BIKTARVY 50-200-25 MG PO TABS: ORAL_TABLET | ORAL | 2 refills | Status: DC

## 2018-09-29 NOTE — Telephone Encounter (Signed)
Received incoming fax for refill request for Biktarvy. Medication approved with 2 refills. Patient needs to make an appointment for future refills.

## 2018-11-21 DIAGNOSIS — S92911K Unspecified fracture of right toe(s), subsequent encounter for fracture with nonunion: Secondary | ICD-10-CM | POA: Diagnosis not present

## 2018-11-21 DIAGNOSIS — S90211A Contusion of right great toe with damage to nail, initial encounter: Secondary | ICD-10-CM | POA: Diagnosis not present

## 2019-01-03 ENCOUNTER — Telehealth: Payer: Self-pay | Admitting: *Deleted

## 2019-01-03 DIAGNOSIS — B20 Human immunodeficiency virus [HIV] disease: Secondary | ICD-10-CM

## 2019-01-03 MED ORDER — BIKTARVY 50-200-25 MG PO TABS: ORAL_TABLET | ORAL | 0 refills | Status: DC

## 2019-01-03 NOTE — Telephone Encounter (Signed)
Received fax from [facility] requesting [place] refill.  This was last filled by patient 11/2.  He was last in the office 11/2017, last had labs 10/2017.   RN called patient, left message asking him to call his doctor's office and make an appointment, that we could only give him 30 more days.  He must be seen for additional refills. RN sent 30 day supply to [facility].

## 2019-01-09 ENCOUNTER — Other Ambulatory Visit: Payer: Self-pay

## 2019-01-09 ENCOUNTER — Other Ambulatory Visit: Payer: Self-pay | Admitting: *Deleted

## 2019-01-09 DIAGNOSIS — B20 Human immunodeficiency virus [HIV] disease: Secondary | ICD-10-CM | POA: Diagnosis not present

## 2019-01-09 DIAGNOSIS — Z21 Asymptomatic human immunodeficiency virus [HIV] infection status: Secondary | ICD-10-CM

## 2019-01-09 DIAGNOSIS — Z113 Encounter for screening for infections with a predominantly sexual mode of transmission: Secondary | ICD-10-CM

## 2019-01-10 LAB — T-HELPER CELL (CD4) - (RCID CLINIC ONLY)
CD4 % Helper T Cell: 38 % (ref 33–65)
CD4 T Cell Abs: 574 /uL (ref 400–1790)

## 2019-01-11 LAB — URINE CYTOLOGY ANCILLARY ONLY
Chlamydia: NEGATIVE
Comment: NEGATIVE
Comment: NORMAL
Neisseria Gonorrhea: NEGATIVE

## 2019-01-15 ENCOUNTER — Other Ambulatory Visit: Payer: Self-pay | Admitting: Pharmacist

## 2019-01-15 DIAGNOSIS — B20 Human immunodeficiency virus [HIV] disease: Secondary | ICD-10-CM

## 2019-01-15 MED ORDER — BIKTARVY 50-200-25 MG PO TABS: 1.0000 | ORAL_TABLET | Freq: Every day | ORAL | 0 refills | Status: DC

## 2019-01-15 NOTE — Progress Notes (Signed)
Resending Rx

## 2019-01-16 LAB — HIV-1 RNA QUANT-NO REFLEX-BLD
HIV 1 RNA Quant: <20 copies/mL
HIV-1 RNA Quant, Log: <1.3 Log copies/mL

## 2019-01-16 LAB — CBC WITH DIFFERENTIAL/PLATELET
Absolute Monocytes: 288 cells/uL (ref 200–950)
Basophils Absolute: 19 cells/uL (ref 0–200)
Basophils Relative: 0.6 %
Eosinophils Absolute: 50 cells/uL (ref 15–500)
Eosinophils Relative: 1.6 %
HCT: 42.3 % (ref 38.5–50.0)
Hemoglobin: 15 g/dL (ref 13.2–17.1)
Lymphs Abs: 1500 cells/uL (ref 850–3900)
MCH: 33.6 pg — ABNORMAL HIGH (ref 27.0–33.0)
MCHC: 35.5 g/dL (ref 32.0–36.0)
MCV: 94.6 fL (ref 80.0–100.0)
MPV: 9.9 fL (ref 7.5–12.5)
Monocytes Relative: 9.3 %
Neutro Abs: 1243 cells/uL — ABNORMAL LOW (ref 1500–7800)
Neutrophils Relative %: 40.1 %
Platelets: 305 10*3/uL (ref 140–400)
RBC: 4.47 10*6/uL (ref 4.20–5.80)
RDW: 12.7 % (ref 11.0–15.0)
Total Lymphocyte: 48.4 %
WBC: 3.1 10*3/uL — ABNORMAL LOW (ref 3.8–10.8)

## 2019-01-16 LAB — COMPLETE METABOLIC PANEL WITH GFR
AG Ratio: 1.4 (calc) (ref 1.0–2.5)
ALT: 10 U/L (ref 9–46)
AST: 15 U/L (ref 10–40)
Albumin: 3.9 g/dL (ref 3.6–5.1)
Alkaline phosphatase (APISO): 46 U/L (ref 36–130)
BUN: 14 mg/dL (ref 7–25)
CO2: 25 mmol/L (ref 20–32)
Calcium: 9.2 mg/dL (ref 8.6–10.3)
Chloride: 103 mmol/L (ref 98–110)
Creat: 1.16 mg/dL (ref 0.60–1.35)
GFR, Est African American: 100 mL/min/{1.73_m2} (ref >=60)
GFR, Est Non African American: 86 mL/min/{1.73_m2} (ref >=60)
Globulin: 2.8 g/dL (calc) (ref 1.9–3.7)
Glucose, Bld: 94 mg/dL (ref 65–99)
Potassium: 4 mmol/L (ref 3.5–5.3)
Sodium: 135 mmol/L (ref 135–146)
Total Bilirubin: 0.6 mg/dL (ref 0.2–1.2)
Total Protein: 6.7 g/dL (ref 6.1–8.1)

## 2019-01-16 LAB — RPR: RPR Ser Ql: NONREACTIVE

## 2019-01-30 ENCOUNTER — Encounter: Payer: Self-pay | Admitting: Family

## 2019-01-30 ENCOUNTER — Ambulatory Visit (INDEPENDENT_AMBULATORY_CARE_PROVIDER_SITE_OTHER): Payer: BC Managed Care – PPO | Admitting: Family

## 2019-01-30 ENCOUNTER — Other Ambulatory Visit: Payer: Self-pay

## 2019-01-30 VITALS — BP 121/76 | HR 60 | Wt 238.0 lb

## 2019-01-30 DIAGNOSIS — Z23 Encounter for immunization: Secondary | ICD-10-CM

## 2019-01-30 DIAGNOSIS — Z79899 Other long term (current) drug therapy: Secondary | ICD-10-CM | POA: Diagnosis not present

## 2019-01-30 DIAGNOSIS — Z113 Encounter for screening for infections with a predominantly sexual mode of transmission: Secondary | ICD-10-CM

## 2019-01-30 DIAGNOSIS — Z0000 Encounter for general adult medical examination without abnormal findings: Secondary | ICD-10-CM

## 2019-01-30 DIAGNOSIS — B20 Human immunodeficiency virus [HIV] disease: Secondary | ICD-10-CM | POA: Diagnosis not present

## 2019-01-30 DIAGNOSIS — Z21 Asymptomatic human immunodeficiency virus [HIV] infection status: Secondary | ICD-10-CM

## 2019-01-30 MED ORDER — BIKTARVY 50-200-25 MG PO TABS: 1.0000 | ORAL_TABLET | Freq: Every day | ORAL | 5 refills | Status: DC

## 2019-01-30 NOTE — Patient Instructions (Signed)
Nice to meet you.  Keep taking your Biktarvy as prescribed.   Refills of your medication have been sent to the pharmacy.  Please let us know if you have any questions.  Plan for follow up in 6 months or sooner if needed with lab work 1-2 weeks prior to appointment.   Have a great day and stay safe!  Happy Holidays!

## 2019-01-30 NOTE — Assessment & Plan Note (Addendum)
   Influenza and second Menveo updated today.    Discussed importance of safe sexual practice to reduce risk of STI.  Declines condoms.  Dental screening up-to-date per recommendations.

## 2019-01-30 NOTE — Progress Notes (Signed)
Subjective:    Patient ID: Eric [name], male    DOB: 04-Feb-1991, 28 y.o.   MRN: [id number]  Chief Complaint  Patient presents with  . Follow-up    no complaints; offered flu shot; offered condoms     HPI:  Eric [name] is a 28 y.o. male with HIV disease who was last seen in the office on 11/21/2017 with good adherence and tolerance to his ART regimen of Biktarvy.  Viral load at the time was undetectable with CD4 count of 620.  Most recent blood work completed on 01/09/2019 with viral load that remains undetectable and CD4 count of 574.  STI testing was negative for gonorrhea, chlamydia, and syphilis.  Kidney function, liver function, and electrolytes within normal ranges.  Eric [name] continues to take his Biktarvy as prescribed with no adverse side effects and occasional missed doses averaging around 1 to 2/month.  Overall feeling very well today with no new concerns/complaints. Denies fevers, chills, night sweats, headaches, changes in vision, neck pain/stiffness, nausea, diarrhea, vomiting, lesions or rashes.  Eric [name] remains covered through [facility] and has no problems obtaining his medication from the pharmacy.  Denies feelings of being down, depressed, or hopeless recently.  He does use marijuana on occasion and consumes alcohol socially.  No tobacco use.  He does remain sexually active and uses condoms.  Working in [employer].  He had braces placed in September and has concerned about transmission of virus.   No Known Allergies    Outpatient Medications Prior to Visit  Medication Sig Dispense Refill  . dorzolamide-timolol (COSOPT) 22.3-6.8 MG/ML ophthalmic solution INT 1 GTT IN OS BID UTD    . bictegravir-emtricitabine-tenofovir AF (BIKTARVY) 50-200-25 MG TABS tablet Take 1 tablet by mouth daily. 30 tablet 0  . acetaminophen (TYLENOL) 325 MG tablet Take 650 mg every 6 (six) hours as needed by mouth for mild pain.    Marland Kitchen ibuprofen (ADVIL,MOTRIN) 200  MG tablet Take 200-800 mg every 6 (six) hours as needed by mouth for moderate pain.     No facility-administered medications prior to visit.     Past Medical History:  Diagnosis Date  . Depression 09/22/2016  . Glaucoma    currently untreated   . HIV (human immunodeficiency virus infection) (HCC) 09/22/2016     Past Surgical History:  Procedure Laterality Date  . NO PAST SURGERIES      Review of Systems  Constitutional: Negative for appetite change, chills, fatigue, fever and unexpected weight change.  Eyes: Negative for visual disturbance.  Respiratory: Negative for cough, chest tightness, shortness of breath and wheezing.   Cardiovascular: Negative for chest pain and leg swelling.  Gastrointestinal: Negative for abdominal pain, constipation, diarrhea, nausea and vomiting.  Genitourinary: Negative for dysuria, flank pain, frequency, genital sores, hematuria and urgency.  Skin: Negative for rash.  Allergic/Immunologic: Negative for immunocompromised state.  Neurological: Negative for dizziness and headaches.      Objective:    BP 121/76   Pulse 60   Wt 238 lb (108 kg)   BMI 31.40 kg/m  Nursing note and vital signs reviewed.  Physical Exam Constitutional:      General: He is not in acute distress.    Appearance: He is well-developed.  Cardiovascular:     Rate and Rhythm: Normal rate and regular rhythm.     Heart sounds: Normal heart sounds.  Pulmonary:     Effort: Pulmonary effort is normal.     Breath sounds: Normal breath sounds.  Skin:    General: Skin is warm and dry.  Neurological:     Mental Status: He is alert and oriented to person, place, and time.  Psychiatric:        Behavior: Behavior normal.        Thought Content: Thought content normal.        Judgment: Judgment normal.      Depression screen [facility] 2/9 01/30/2019 11/21/2017 07/19/2017 03/24/2017 01/26/2017  Decreased Interest 0 0 0 0 0  Down, Depressed, Hopeless 0 0 0 0 0  PHQ - 2 Score 0 0 0  0 0       Assessment & Plan:    Patient Active Problem List   Diagnosis Date Noted  . S/P thoracentesis   . Sepsis due to Streptococcus, group A ([place])   . Bacteremia   . Multifocal pneumonia 12/21/2016  . AKI (acute kidney injury) ([place]) 12/21/2016  . Hypotension 12/21/2016  . HIV (human immunodeficiency virus infection) ([place]) 09/22/2016  . Healthcare maintenance 09/22/2016  . Depression 09/22/2016     Problem List Items Addressed This Visit      Other   HIV (human immunodeficiency virus infection) ([place]) - Primary (Chronic)    Mr. Rivers continues to have well-controlled HIV disease with good adherence and tolerance to his ART regimen of Biktarvy.  No signs/symptoms of opportunistic infection or progressive HIV disease.  We reviewed his lab work and discussed the plan of care including his concerns regarding his braces.  Continue current dose of Biktarvy.  Plan for follow-up in 6 months or sooner if needed with lab work 1 to 2 weeks prior to appointment.      Relevant Medications   bictegravir-emtricitabine-tenofovir AF (BIKTARVY) 50-200-25 MG TABS tablet   Other Relevant Orders   6 month CD4   6 month VL   6 month CMP   6 month CBC   MENINGOCOCCAL MCV4O(MENVEO) (Completed)   Healthcare maintenance     Influenza and second Menveo updated today.    Discussed importance of safe sexual practice to reduce risk of STI.  Declines condoms.  Dental screening up-to-date per recommendations.       Other Visit Diagnoses    Screening for STDs (sexually transmitted diseases)       Relevant Orders   6 month RPR   Urine cytology ancillary only([place])   Pharmacologic therapy       Relevant Orders   6 month lipid   Need for immunization against influenza       Relevant Orders   Flu Vaccine QUAD 36+ mos IM (Completed)   Need for meningitis vaccination       Relevant Orders   MENINGOCOCCAL MCV4O(MENVEO) (Completed)       I have discontinued Truman Hayward Grigoryan's  ibuprofen and acetaminophen. I am also having him maintain his dorzolamide-timolol and Biktarvy.   Meds ordered this encounter  Medications  . bictegravir-emtricitabine-tenofovir AF (BIKTARVY) 50-200-25 MG TABS tablet    Sig: Take 1 tablet by mouth daily.    Dispense:  30 tablet    Refill:  5    Order Specific Question:   Supervising Provider    Answer:   Carlyle Basques [[id number]]     Follow-up: Return in about 6 months (around 07/31/2019), or if symptoms worsen or fail to improve.   Terri Piedra, MSN, FNP-C Nurse Practitioner [facility] [place] number: [phone]

## 2019-01-30 NOTE — Assessment & Plan Note (Signed)
Eric [name] continues to have well-controlled HIV disease with good adherence and tolerance to his ART regimen of Biktarvy.  No signs/symptoms of opportunistic infection or progressive HIV disease.  We reviewed his lab work and discussed the plan of care including his concerns regarding his braces.  Continue current dose of Biktarvy.  Plan for follow-up in 6 months or sooner if needed with lab work 1 to 2 weeks prior to appointment.

## 2019-02-07 ENCOUNTER — Other Ambulatory Visit: Payer: Self-pay | Admitting: Pharmacist

## 2019-02-07 DIAGNOSIS — B20 Human immunodeficiency virus [HIV] disease: Secondary | ICD-10-CM

## 2019-04-09 DIAGNOSIS — Z20822 Contact with and (suspected) exposure to covid-19: Secondary | ICD-10-CM | POA: Diagnosis not present

## 2019-04-23 ENCOUNTER — Telehealth: Payer: Self-pay | Admitting: *Deleted

## 2019-04-23 NOTE — Telephone Encounter (Signed)
Patient called with questions regarding transmission. He is in a new sero-discordant relationship, would like to 1) make sure he is still undetectable (although he continues to take biktarvy without issue), 2) know a little more about PrEP and how his partner could get an appointment for this if he so chooses. RN counseled patient on U=U and safe sex practices. He verbalized understanding and will call back after lunch if he needs to make lab and/or PrEP appointment. Andree Coss, RN

## 2019-06-11 ENCOUNTER — Other Ambulatory Visit: Payer: Self-pay

## 2019-06-11 DIAGNOSIS — B20 Human immunodeficiency virus [HIV] disease: Secondary | ICD-10-CM

## 2019-06-11 MED ORDER — BIKTARVY 50-200-25 MG PO TABS: 1.0000 | ORAL_TABLET | Freq: Every day | ORAL | 3 refills | Status: DC

## 2019-07-31 ENCOUNTER — Other Ambulatory Visit: Payer: BC Managed Care – PPO

## 2019-08-20 ENCOUNTER — Other Ambulatory Visit (HOSPITAL_COMMUNITY)
Admission: RE | Admit: 2019-08-20 | Discharge: 2019-08-20 | Disposition: A | Discharge status: Home / Self Care | Payer: BC Managed Care – PPO | Source: Ambulatory Visit | Attending: Family | Admitting: Family

## 2019-08-20 ENCOUNTER — Other Ambulatory Visit: Payer: Self-pay

## 2019-08-20 ENCOUNTER — Encounter: Payer: BC Managed Care – PPO | Admitting: Family

## 2019-08-20 DIAGNOSIS — Z113 Encounter for screening for infections with a predominantly sexual mode of transmission: Secondary | ICD-10-CM

## 2019-08-20 DIAGNOSIS — Z79899 Other long term (current) drug therapy: Secondary | ICD-10-CM | POA: Diagnosis not present

## 2019-08-20 DIAGNOSIS — B20 Human immunodeficiency virus [HIV] disease: Secondary | ICD-10-CM | POA: Diagnosis not present

## 2019-08-21 LAB — T-HELPER CELL (CD4) - (RCID CLINIC ONLY)
CD4 % Helper T Cell: 38 % (ref 33–65)
CD4 T Cell Abs: 761 /uL (ref 400–1790)

## 2019-08-22 LAB — URINE CYTOLOGY ANCILLARY ONLY
Chlamydia: NEGATIVE
Comment: NEGATIVE
Comment: NORMAL
Neisseria Gonorrhea: NEGATIVE

## 2019-08-23 LAB — COMPREHENSIVE METABOLIC PANEL
AG Ratio: 1.4 (calc) (ref 1.0–2.5)
ALT: 13 U/L (ref 9–46)
AST: 18 U/L (ref 10–40)
Albumin: 4.1 g/dL (ref 3.6–5.1)
Alkaline phosphatase (APISO): 43 U/L (ref 36–130)
BUN: 18 mg/dL (ref 7–25)
CO2: 27 mmol/L (ref 20–32)
Calcium: 8.9 mg/dL (ref 8.6–10.3)
Chloride: 106 mmol/L (ref 98–110)
Creat: 1.12 mg/dL (ref 0.60–1.35)
Globulin: 2.9 g/dL (calc) (ref 1.9–3.7)
Glucose, Bld: 86 mg/dL (ref 65–99)
Potassium: 4.4 mmol/L (ref 3.5–5.3)
Sodium: 138 mmol/L (ref 135–146)
Total Bilirubin: 0.5 mg/dL (ref 0.2–1.2)
Total Protein: 7 g/dL (ref 6.1–8.1)

## 2019-08-23 LAB — CBC
HCT: 43.3 % (ref 38.5–50.0)
Hemoglobin: 14.6 g/dL (ref 13.2–17.1)
MCH: 31.9 pg (ref 27.0–33.0)
MCHC: 33.7 g/dL (ref 32.0–36.0)
MCV: 94.5 fL (ref 80.0–100.0)
MPV: 10.5 fL (ref 7.5–12.5)
Platelets: 283 10*3/uL (ref 140–400)
RBC: 4.58 10*6/uL (ref 4.20–5.80)
RDW: 12.4 % (ref 11.0–15.0)
WBC: 4.3 10*3/uL (ref 3.8–10.8)

## 2019-08-23 LAB — LIPID PANEL
Cholesterol: 171 mg/dL (ref <200)
HDL: 67 mg/dL (ref >=40)
LDL Cholesterol (Calc): 87 mg/dL (calc)
Non-HDL Cholesterol (Calc): 104 mg/dL (calc) (ref <130)
Total CHOL/HDL Ratio: 2.6 (calc) (ref <5.0)
Triglycerides: 77 mg/dL (ref <150)

## 2019-08-23 LAB — RPR: RPR Ser Ql: NONREACTIVE

## 2019-08-23 LAB — HIV-1 RNA QUANT-NO REFLEX-BLD
HIV 1 RNA Quant: <20 copies/mL
HIV-1 RNA Quant, Log: <1.3 Log copies/mL

## 2019-08-27 ENCOUNTER — Encounter: Payer: BC Managed Care – PPO | Admitting: Family

## 2019-08-29 ENCOUNTER — Ambulatory Visit (INDEPENDENT_AMBULATORY_CARE_PROVIDER_SITE_OTHER): Payer: BC Managed Care – PPO | Admitting: Family

## 2019-08-29 ENCOUNTER — Encounter: Payer: Self-pay | Admitting: Family

## 2019-08-29 ENCOUNTER — Other Ambulatory Visit: Payer: Self-pay

## 2019-08-29 VITALS — BP 126/77 | HR 64 | Temp 98.4°F | Wt 266.0 lb

## 2019-08-29 DIAGNOSIS — Z113 Encounter for screening for infections with a predominantly sexual mode of transmission: Secondary | ICD-10-CM | POA: Diagnosis not present

## 2019-08-29 DIAGNOSIS — Z0000 Encounter for general adult medical examination without abnormal findings: Secondary | ICD-10-CM | POA: Diagnosis not present

## 2019-08-29 DIAGNOSIS — Z21 Asymptomatic human immunodeficiency virus [HIV] infection status: Secondary | ICD-10-CM

## 2019-08-29 MED ORDER — BIKTARVY 50-200-25 MG PO TABS: 1.0000 | ORAL_TABLET | Freq: Every day | ORAL | 6 refills | Status: DC

## 2019-08-29 NOTE — Assessment & Plan Note (Signed)
   Dental care up-to-date per recommendations.  Discussed/recommended Covid vaccination.  Discussed importance of safe sexual practice to reduce risk of STI.  Condoms declined

## 2019-08-29 NOTE — Patient Instructions (Signed)
Nice to see you  Continue to take your [place] daily as prescribed.  Refills have been sent to the pharmacy.  Have a great trip to [facility]!  Plan for follow up in 6 months or sooner if needed with lab work 1-2 weeks prior to your appointment.  Have a great day and stay safe!

## 2019-08-29 NOTE — Progress Notes (Signed)
Subjective:    Patient ID: Eric [name], male    DOB: 29-Mar-1990, 29 y.o.   MRN: [id number]  Chief Complaint  Patient presents with   Follow-up    B20     HPI:  Eric [name] is a 29 y.o. male with HIV disease who was last seen in the office on 01/30/2019 with well-controlled HIV disease and good adherence and tolerance to his ART regimen of Biktarvy.  Lab work at the time showed a viral load that was undetectable with CD4 count of 574.  Most recent blood work completed on 08/20/2019 with viral load that remains undetectable and CD4 count of 761.  STI testing was negative for gonorrhea, chlamydia, and syphilis.  Here today for routine follow-up.  Eric [name] continues to take his Biktarvy daily as prescribed with no adverse side effects or missed doses since his last office visit.  Overall feeling well today with no new concerns/complaints. Denies fevers, chills, night sweats, headaches, changes in vision, neck pain/stiffness, nausea, diarrhea, vomiting, lesions or rashes.  Eric [name] has no problems obtaining his medication remains covered through [facility].  Denies feelings of being down, depressed, or hopeless recently.  No current recreational or illicit drug use or tobacco use.  Alcohol consumption is on occasion/socially.  Declines condoms today.  Dental cleaning is up-to-date per recommendations.  Has not received the Covid vaccination.   No Known Allergies    Outpatient Medications Prior to Visit  Medication Sig Dispense Refill   dorzolamide-timolol (COSOPT) 22.3-6.8 MG/ML ophthalmic solution INT 1 GTT IN OS BID UTD     bictegravir-emtricitabine-tenofovir AF (BIKTARVY) 50-200-25 MG TABS tablet Take 1 tablet by mouth daily. 30 tablet 3   No facility-administered medications prior to visit.     Past Medical History:  Diagnosis Date   Depression 09/22/2016   Glaucoma    currently untreated    HIV (human immunodeficiency virus infection) (HCC)  09/22/2016     Past Surgical History:  Procedure Laterality Date   NO PAST SURGERIES        Review of Systems  Constitutional: Negative for appetite change, chills, fatigue, fever and unexpected weight change.  Eyes: Negative for visual disturbance.  Respiratory: Negative for cough, chest tightness, shortness of breath and wheezing.   Cardiovascular: Negative for chest pain and leg swelling.  Gastrointestinal: Negative for abdominal pain, constipation, diarrhea, nausea and vomiting.  Genitourinary: Negative for dysuria, flank pain, frequency, genital sores, hematuria and urgency.  Skin: Negative for rash.  Allergic/Immunologic: Negative for immunocompromised state.  Neurological: Negative for dizziness and headaches.      Objective:    BP 126/77    Pulse 64    Temp 98.4 F (36.9 C) (Oral)    Wt 266 lb (120.7 kg)    BMI 35.09 kg/m  Nursing note and vital signs reviewed.  Physical Exam Constitutional:      General: He is not in acute distress.    Appearance: He is well-developed.  Eyes:     Conjunctiva/sclera: Conjunctivae normal.  Cardiovascular:     Rate and Rhythm: Normal rate and regular rhythm.     Heart sounds: Normal heart sounds. No murmur heard.  No friction rub. No gallop.   Pulmonary:     Effort: Pulmonary effort is normal. No respiratory distress.     Breath sounds: Normal breath sounds. No wheezing or rales.  Chest:     Chest wall: No tenderness.  Abdominal:     General: Bowel sounds are normal.  Palpations: Abdomen is soft.     Tenderness: There is no abdominal tenderness.  Musculoskeletal:     Cervical back: Neck supple.  Lymphadenopathy:     Cervical: No cervical adenopathy.  Skin:    General: Skin is warm and dry.     Findings: No rash.  Neurological:     Mental Status: He is alert and oriented to person, place, and time.  Psychiatric:        Behavior: Behavior normal.        Thought Content: Thought content normal.        Judgment:  Judgment normal.      Depression screen [facility] 2/9 01/30/2019 11/21/2017 07/19/2017 03/24/2017 01/26/2017  Decreased Interest 0 0 0 0 0  Down, Depressed, Hopeless 0 0 0 0 0  PHQ - 2 Score 0 0 0 0 0       Assessment & Plan:    Patient Active Problem List   Diagnosis Date Noted   S/P thoracentesis    Sepsis due to Streptococcus, group A (HCC)    Bacteremia    Multifocal pneumonia 12/21/2016   AKI (acute kidney injury) (HCC) 12/21/2016   Hypotension 12/21/2016   HIV (human immunodeficiency virus infection) (HCC) 09/22/2016   Healthcare maintenance 09/22/2016   Depression 09/22/2016     Problem List Items Addressed This Visit      Other   HIV (human immunodeficiency virus infection) (HCC) - Primary (Chronic)    Eric [name] continues to have well-controlled HIV disease with good adherence and tolerance to his ART regimen of Biktarvy.  No signs/symptoms of opportunistic infection or progressive HIV disease.  We reviewed lab work and discussed the plan of care.  Continue current dose of Biktarvy.  Plan for follow-up in 6 months or sooner if needed with lab work 1 to 2 weeks prior to appointment or on same day.      Relevant Medications   bictegravir-emtricitabine-tenofovir AF (BIKTARVY) 50-200-25 MG TABS tablet   Other Relevant Orders   HIV-1 RNA quant-no reflex-bld   CBC   T-helper cell (CD4)- ([facility] clinic only)   Comprehensive metabolic panel   Healthcare maintenance     Dental care up-to-date per recommendations.  Discussed/recommended Covid vaccination.  Discussed importance of safe sexual practice to reduce risk of STI.  Condoms declined       Other Visit Diagnoses    Screening for STDs (sexually transmitted diseases)       Relevant Orders   RPR       I am having Eric [name] maintain his dorzolamide-timolol and Biktarvy.   Meds ordered this encounter  Medications   bictegravir-emtricitabine-tenofovir AF (BIKTARVY) 50-200-25 MG TABS tablet     Sig: Take 1 tablet by mouth daily.    Dispense:  30 tablet    Refill:  6    Order Specific Question:   Supervising Provider    Answer:   Judyann Munson [[id number]]     Follow-up: Return in about 6 months (around 02/29/2020).   Marcos Eke, MSN, FNP-C Nurse Practitioner [facility] Main number: [phone]

## 2019-08-29 NOTE — Assessment & Plan Note (Signed)
Eric [name] continues to have well-controlled HIV disease with good adherence and tolerance to his ART regimen of Biktarvy.  No signs/symptoms of opportunistic infection or progressive HIV disease.  We reviewed lab work and discussed the plan of care.  Continue current dose of Biktarvy.  Plan for follow-up in 6 months or sooner if needed with lab work 1 to 2 weeks prior to appointment or on same day.

## 2019-10-02 IMAGING — CR DG CHEST 2V
2 series · 2 of 2 positions shown · non-contrast
Comparison: None.

CLINICAL DATA: Flu like symptoms for 1 week. General body aches and
chills. Recent diagnosis of HIV.

EXAM:
CHEST  2 VIEW

[chest pa]
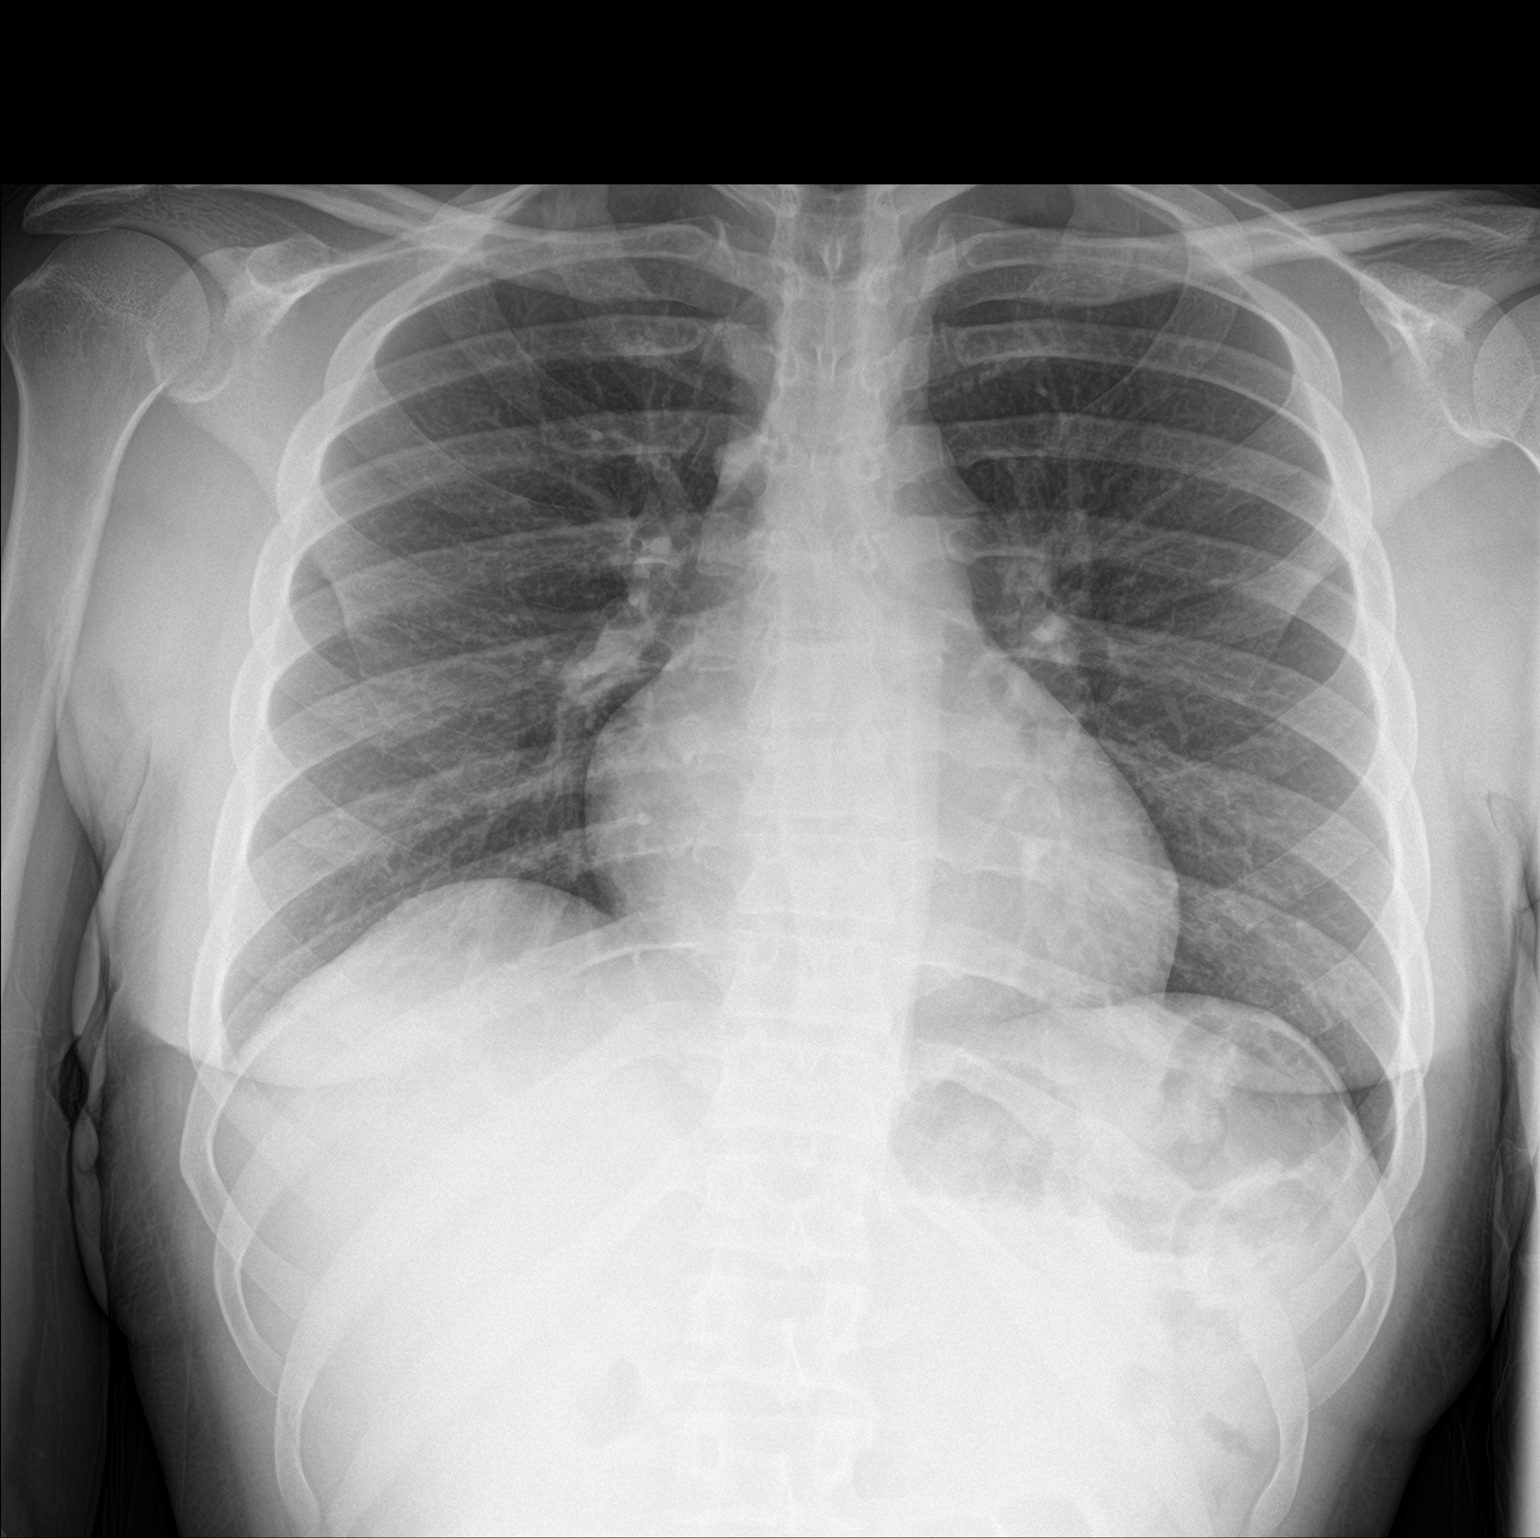

[chest lat]
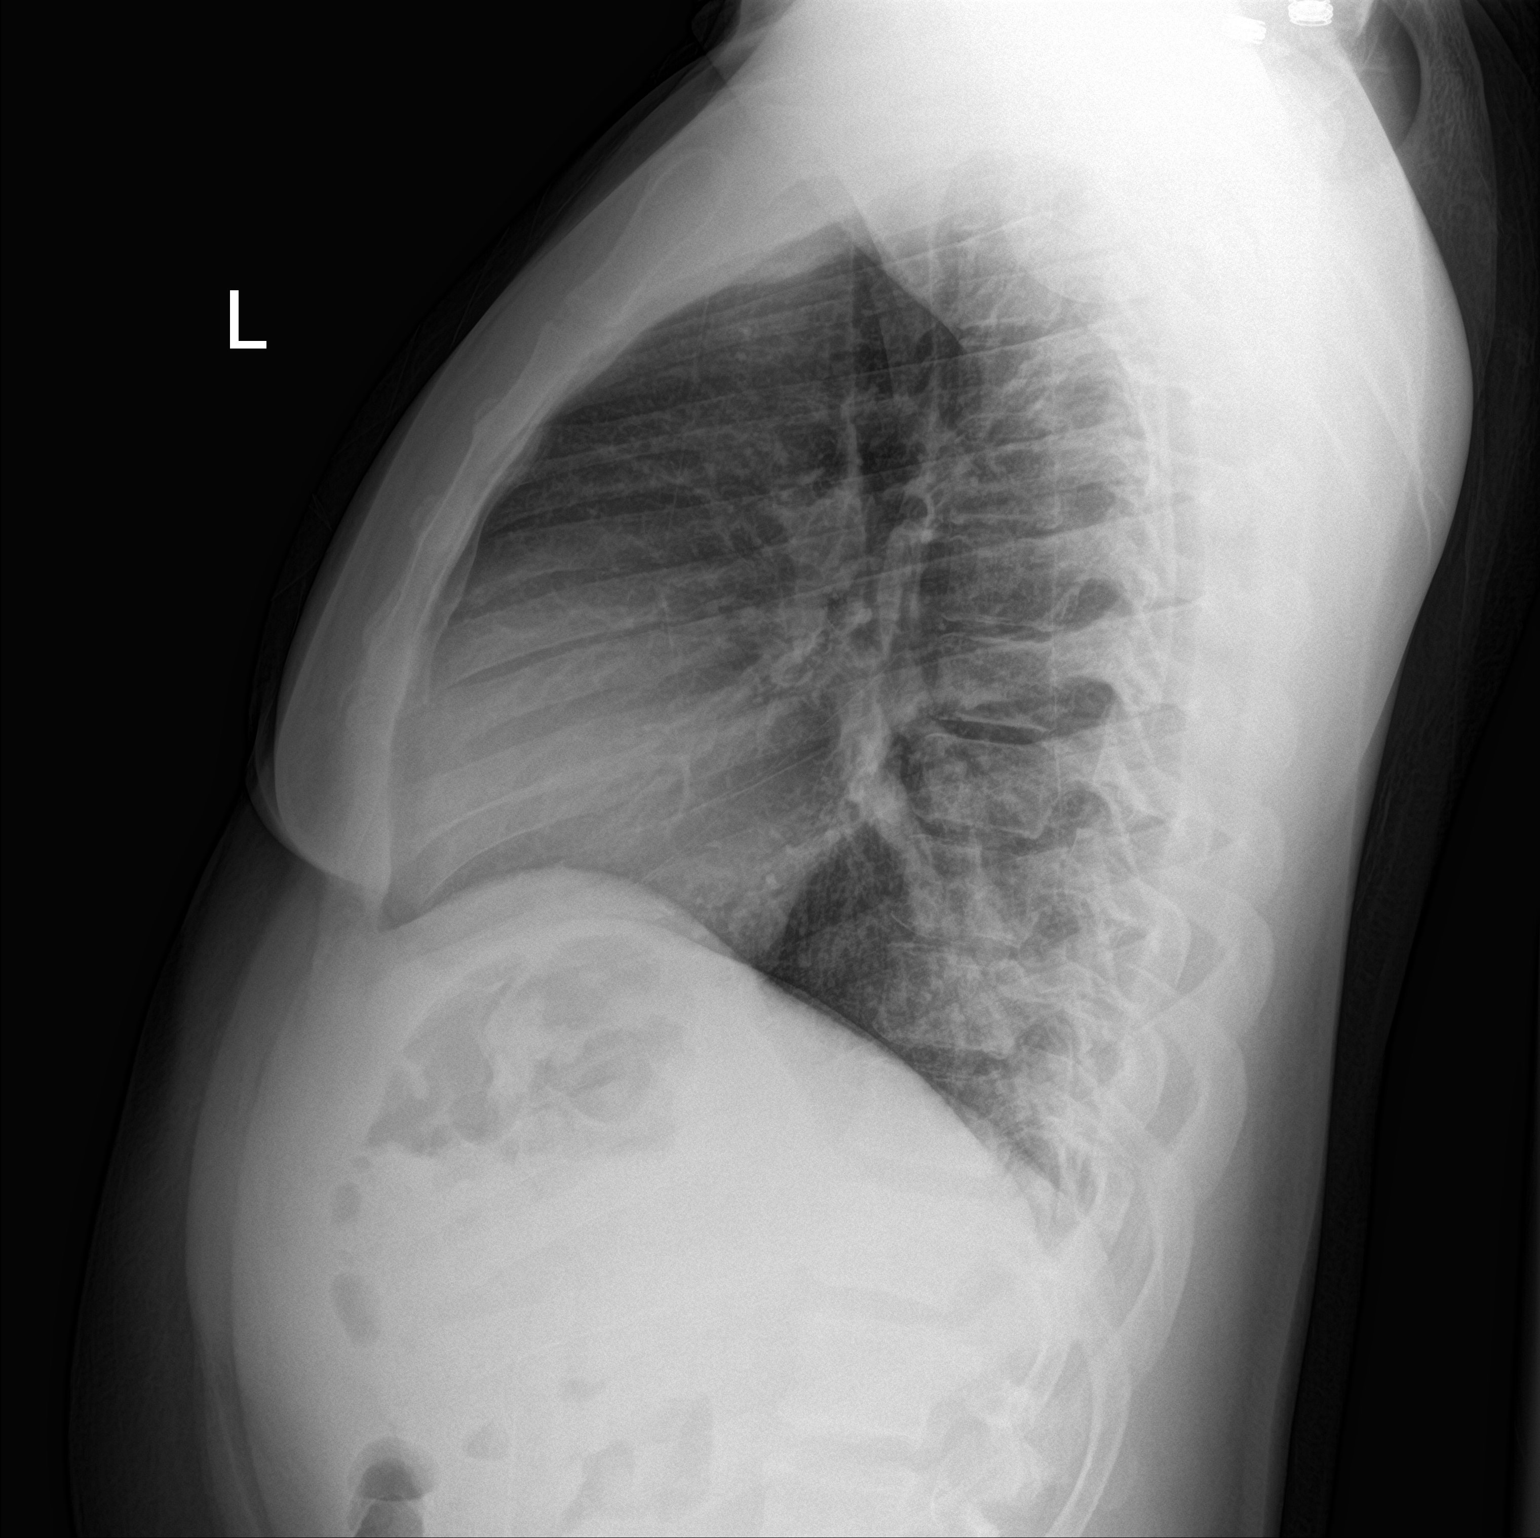

[2 of 2 positions shown; findings below may reference images not displayed]

FINDINGS: Posterior and lower lobe airspace disease is evident on the lateral
view. This is not well visualized in the PA view, with may be in the
medial left lung. The upper lung fields are clear. Heart size
normal. The visualized soft tissues and bony thorax are
unremarkable.
IMPRESSION: Left lower lobe pneumonia.

## 2019-10-03 IMAGING — CT CT ANGIO CHEST
3 of 7 series · 18 of 36 positions shown · IV contrast (Omni 300)
Comparison: Chest radiographs 1411 hours today and earlier.

CLINICAL DATA: 26-year-old male diagnosed with HIV in [REDACTED].
Increasing shortness of Breath. Recently diagnosed with pneumonia
and started on antibiotics. Abnormal D-dimer.

EXAM:
CT ANGIOGRAPHY CHEST WITH CONTRAST
TECHNIQUE: Multidetector CT imaging of the chest was performed using the
standard protocol during bolus administration of intravenous
contrast. Multiplanar CT image reconstructions and MIPs were
obtained to evaluate the vascular anatomy.
CONTRAST:  100mL 57POU1-CS1 IOPAMIDOL (57POU1-CS1) INJECTION 76%

[Series 6: pe thins · axial · 0.73mm/px · z∈[+1220,+1466]mm · 13 of 288 slices shown]
[im 21/288  lung]
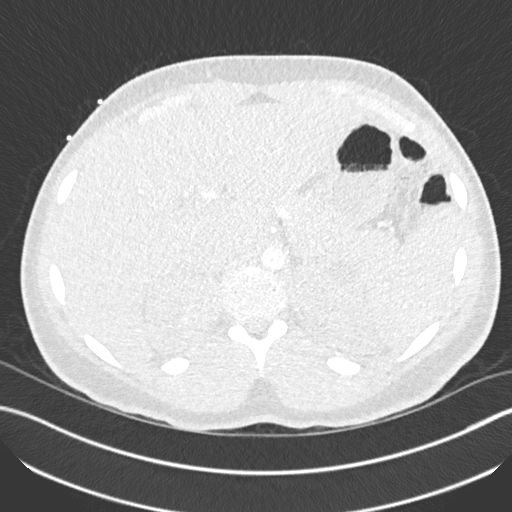
[im 42/288  mediastinal]
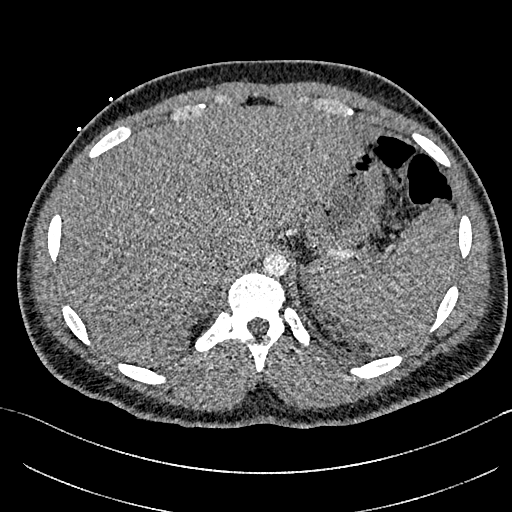
[im 62/288  lung]
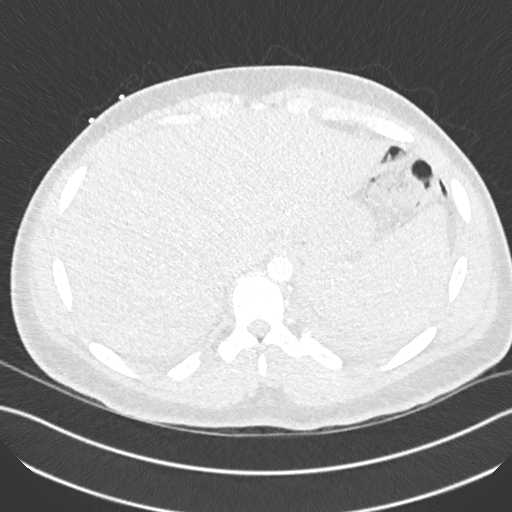
[im 83/288  mediastinal]
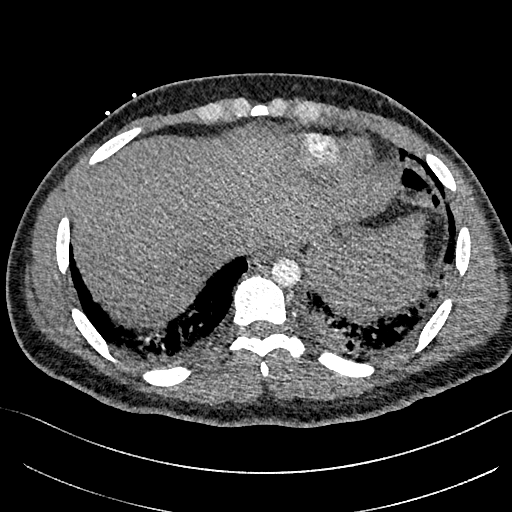
[im 103/288  lung]
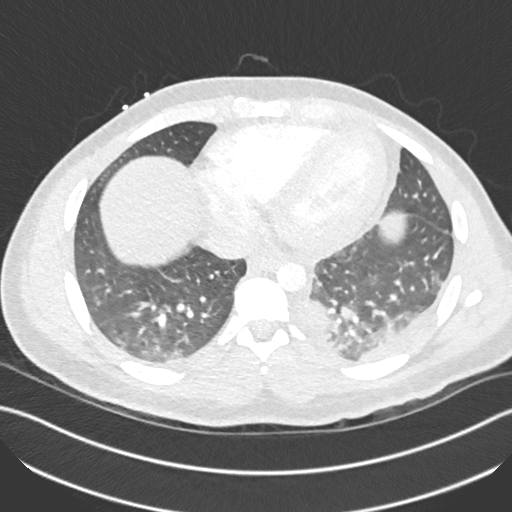
[im 124/288  mediastinal]
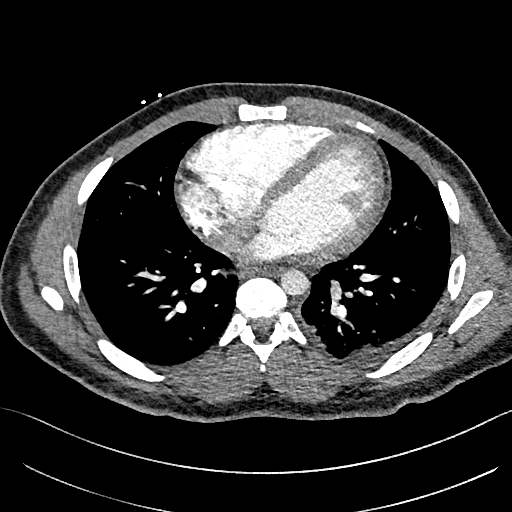
[im 144/288  lung]
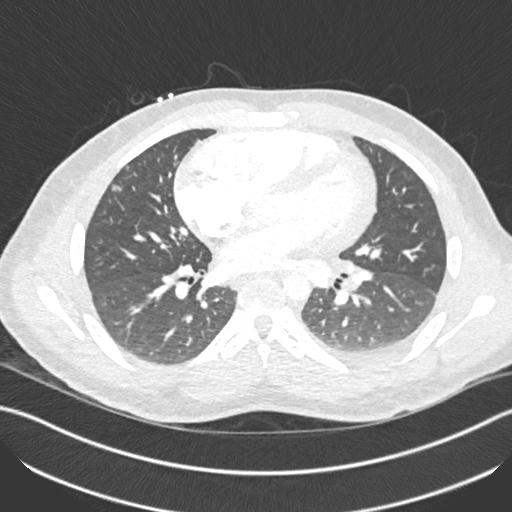
[im 165/288  mediastinal]
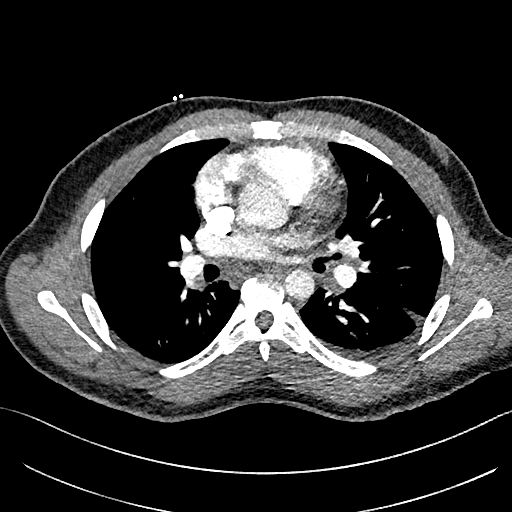
[im 185/288  lung]
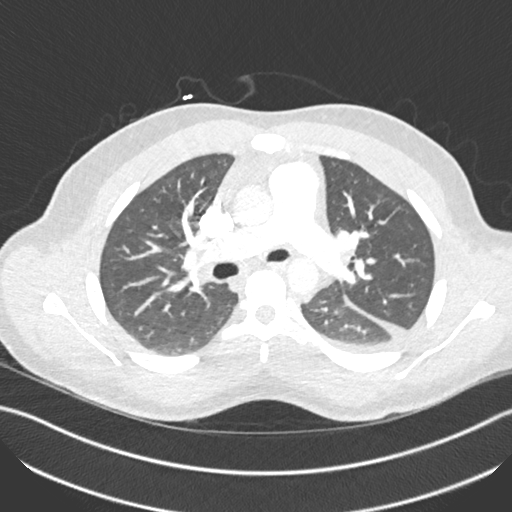
[im 206/288  mediastinal]
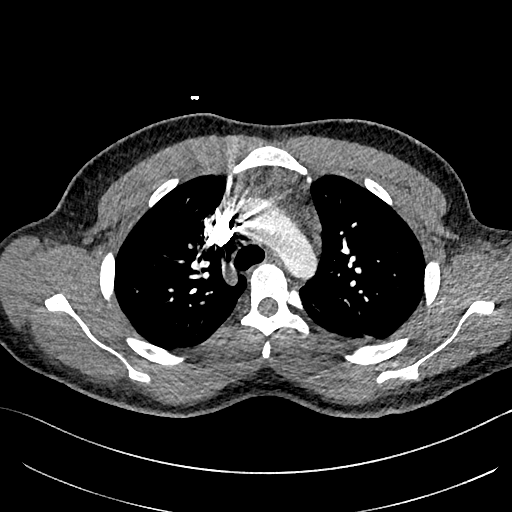
[im 226/288  lung]
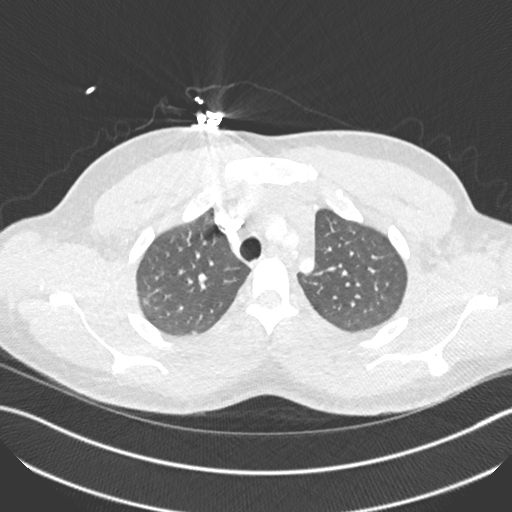
[im 247/288  mediastinal]
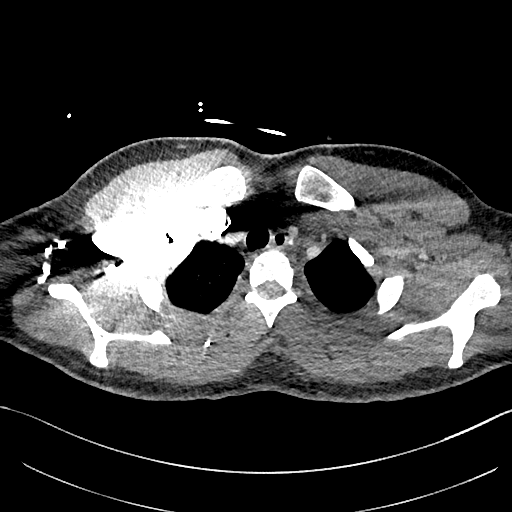
[im 267/288  lung]
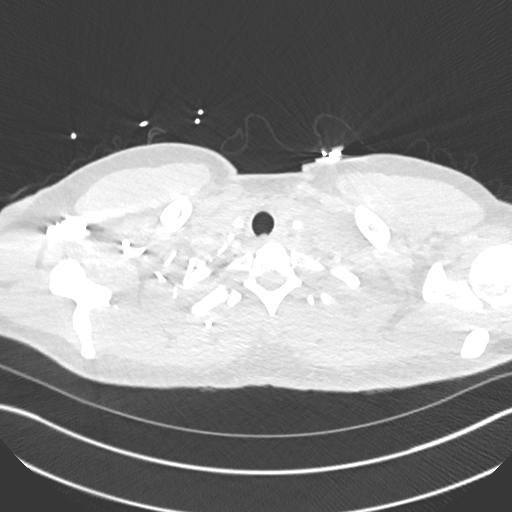

[Series 7: pe lung · axial · 0.73mm/px · z∈[+1300,+1440]mm · 4 of 118 slices shown]
[im 24/118  mediastinal]
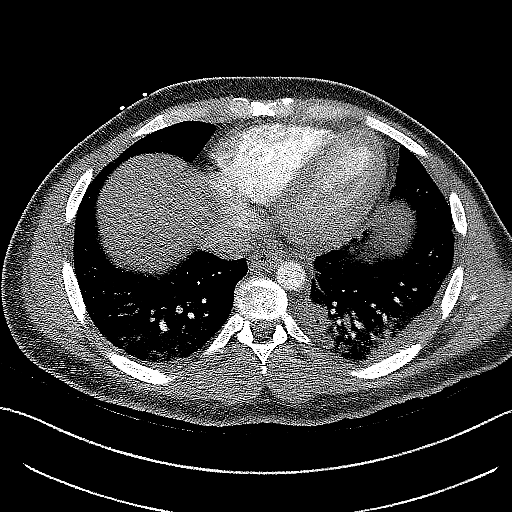
[im 47/118  mediastinal]
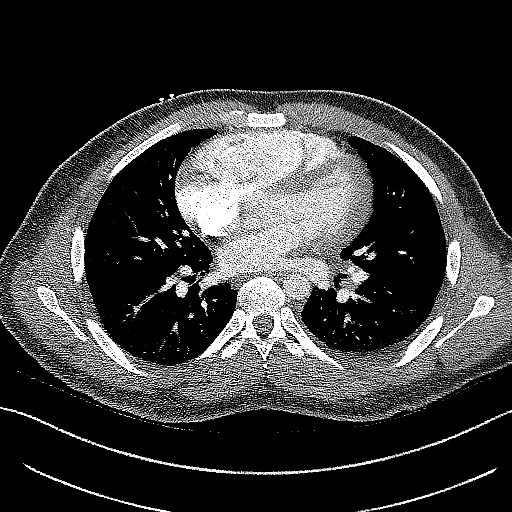
[im 71/118  mediastinal]
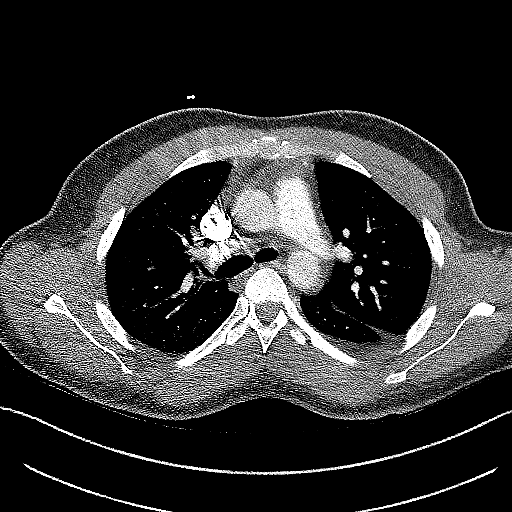
[im 94/118  mediastinal]
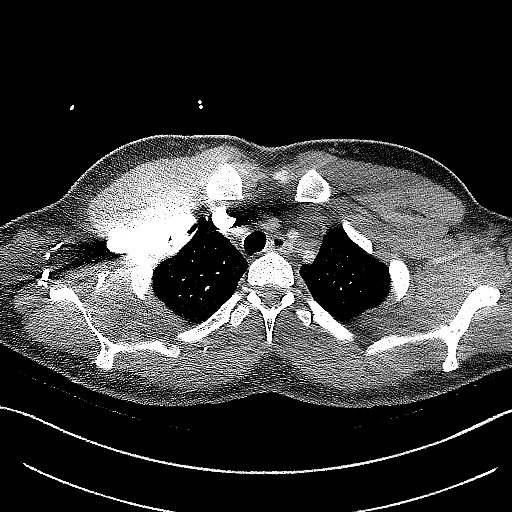

[Series 8: pe 2mm cor · coronal · 0.59mm/px · 1 of 146 slices shown]
[im 73/146  mediastinal]
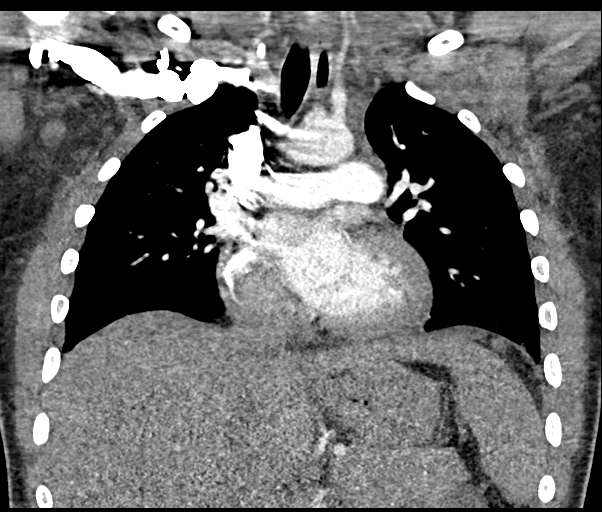

[18 of 36 positions shown; findings below may reference images not displayed]

FINDINGS: Cardiovascular: Suboptimal contrast bolus timing in the pulmonary
arterial tree.

No focal filling defect identified in the pulmonary arteries to
suggest acute pulmonary embolism.

Borderline to mild cardiomegaly. No pericardial effusion. Grossly
negative visible aorta.

Mediastinum/Nodes: Thymic tissue in the superior mediastinum is
increased over that expected for age. Similar mildly increased
mediastinal and bilateral hilar lymph nodes.

Lungs/Pleura: Major airways are patent. Trace left pleural effusion
partially tracking into the left major fissure. Patchy and confluent
lower lobe pulmonary opacity, greater on the left -with maximal
involvement of the left lower lobe medial and posterior basal
segments.

Superimposed mild peripheral peribronchial nodularity in the
anterior right middle lobe. Subtle distal peribronchial ground-glass
nodularity in the right upper lobe.

Upper Abdomen: Visible liver, pancreas, and spleen appear grossly
normal. Heterogeneity of the renal upper poles is incompletely
visualized but might reflect multiple renal cysts. Negative visible
bowel.

Musculoskeletal: Negative.

Review of the MIP images confirms the above findings.
IMPRESSION: 1.  Negative for acute pulmonary embolus.
2. Bilateral multifocal Bronchopneumonia suspected, worst in the
left lower lobe. Associated trace Left Pleural Effusion.
3. Nonspecific increased thymus, mediastinal and hilar lymph node
tissue. Differential considerations include reactive lymphadenopathy
related to #2 versus HIV related lymphoproliferation.
4. Partially visible renal heterogeneity, perhaps bilateral simple
renal cysts.

## 2019-10-03 IMAGING — DX DG CHEST 1V PORT
1 series · 1 of 1 positions shown · non-contrast
Comparison: Chest radiograph performed 12/20/2016

CLINICAL DATA: Acute onset of left lower chest pain and shortness
of breath.

EXAM:
PORTABLE CHEST 1 VIEW

[chest ap]
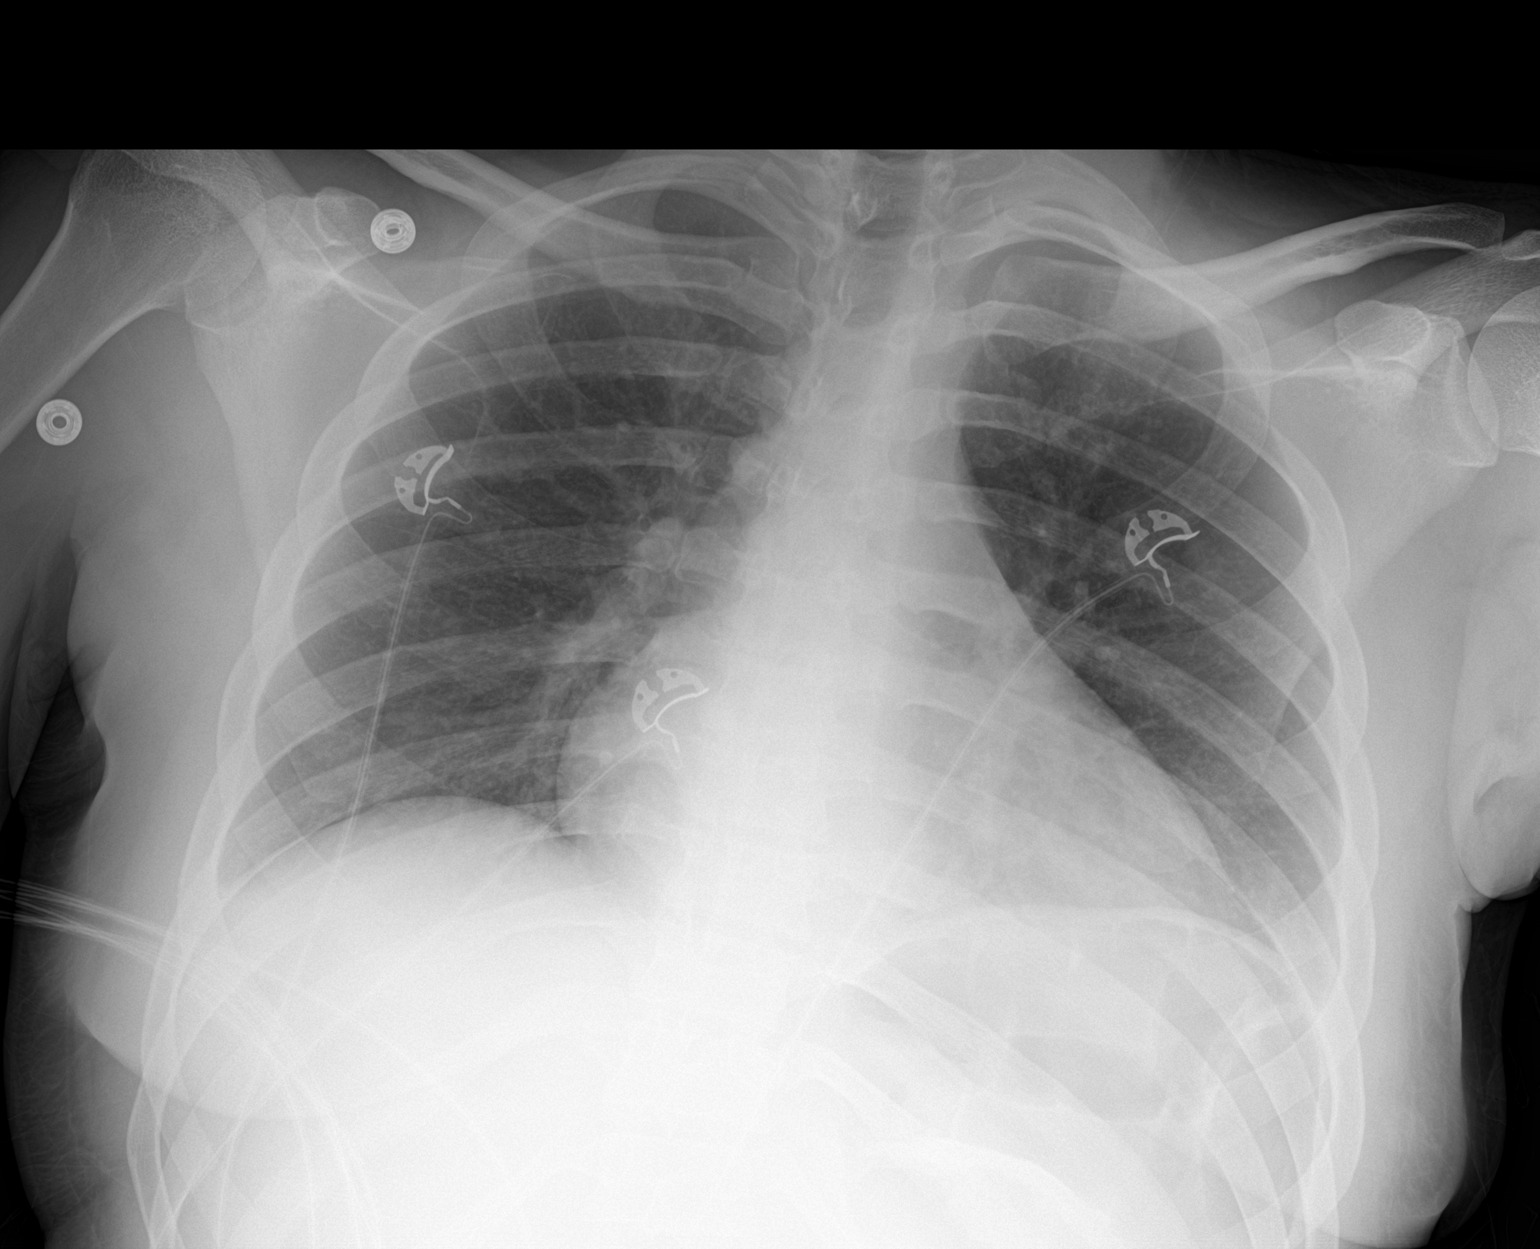

[1 of 1 positions shown; findings below may reference images not displayed]

FINDINGS: The lungs are well-aerated and clear. There is no evidence of focal
opacification, pleural effusion or pneumothorax.

The cardiomediastinal silhouette is borderline normal in size. No
acute osseous abnormalities are seen.
IMPRESSION: No acute cardiopulmonary process seen.

## 2019-10-11 DIAGNOSIS — Z20828 Contact with and (suspected) exposure to other viral communicable diseases: Secondary | ICD-10-CM | POA: Diagnosis not present

## 2019-10-12 ENCOUNTER — Other Ambulatory Visit: Payer: Self-pay

## 2019-10-29 DIAGNOSIS — H40003 Preglaucoma, unspecified, bilateral: Secondary | ICD-10-CM | POA: Diagnosis not present

## 2019-10-29 DIAGNOSIS — Q150 Congenital glaucoma: Secondary | ICD-10-CM | POA: Diagnosis not present

## 2020-01-28 ENCOUNTER — Other Ambulatory Visit: Payer: BC Managed Care – PPO

## 2020-02-11 ENCOUNTER — Encounter: Payer: BC Managed Care – PPO | Admitting: Family

## 2020-02-12 ENCOUNTER — Other Ambulatory Visit: Payer: BC Managed Care – PPO

## 2020-03-06 ENCOUNTER — Other Ambulatory Visit: Payer: Self-pay | Admitting: Family

## 2020-03-06 ENCOUNTER — Ambulatory Visit (INDEPENDENT_AMBULATORY_CARE_PROVIDER_SITE_OTHER): Payer: Self-pay | Admitting: Family

## 2020-03-06 ENCOUNTER — Ambulatory Visit: Payer: Self-pay

## 2020-03-06 ENCOUNTER — Other Ambulatory Visit: Payer: Self-pay

## 2020-03-06 ENCOUNTER — Telehealth: Payer: Self-pay

## 2020-03-06 ENCOUNTER — Encounter: Payer: Self-pay | Admitting: Family

## 2020-03-06 VITALS — BP 133/88 | HR 67 | Temp 98.0°F | Wt 287.0 lb

## 2020-03-06 DIAGNOSIS — Z21 Asymptomatic human immunodeficiency virus [HIV] infection status: Secondary | ICD-10-CM

## 2020-03-06 DIAGNOSIS — Z113 Encounter for screening for infections with a predominantly sexual mode of transmission: Secondary | ICD-10-CM

## 2020-03-06 DIAGNOSIS — Z0000 Encounter for general adult medical examination without abnormal findings: Secondary | ICD-10-CM

## 2020-03-06 DIAGNOSIS — R197 Diarrhea, unspecified: Secondary | ICD-10-CM

## 2020-03-06 MED ORDER — BIKTARVY 50-200-25 MG PO TABS: 1.0000 | ORAL_TABLET | Freq: Every day | ORAL | 2 refills | Status: DC

## 2020-03-06 MED FILL — BIKTARVY 50-200-25 MG TABS: 50-200-25 | 30 days supply | Qty: 30 | Fill #0

## 2020-03-06 NOTE — Patient Instructions (Addendum)
Nice to see you.  We will check your lab work today.  Continue to take your [place] daily as prescribed.   Samples have been provided while awaiting financial assistance.  For diarrhea, recommend increasing fiber and can use Immodium as needed (be careful not to over use).   Plan for follow up in 3 months or sooner if needed with lab work on the same day.  Have a great day and stay safe!

## 2020-03-06 NOTE — Assessment & Plan Note (Signed)
   Discussed importance of safe sexual practice to reduce risk of STI.  Condoms declined.  Declines flu shot.  Discussed/recommended Covid booster when eligible next month.

## 2020-03-06 NOTE — Assessment & Plan Note (Signed)
Eric [name] has acute onset diarrhea of unclear origin although likely related to changes in diet and possibly antibiotic use. Does not sound consistent with C. Diff. Recommend immodium and increasing fiber first. If no improvement will test C. Diff.

## 2020-03-06 NOTE — Telephone Encounter (Signed)
Per financial team patient needs to use [facility]. Valarie Cones

## 2020-03-06 NOTE — Assessment & Plan Note (Signed)
Mr. Golphin continues to have well-controlled HIV disease with good adherence and tolerance to his ART regimen of Biktarvy.  No signs/symptoms of opportunistic infection or progressive HIV disease.  Diarrhea unlikely related to HIV although cannot be ruled out.  Check blood work today.  Continue current dose of Biktarvy.  Plan for follow-up in 3 months or sooner if needed with lab work on the same day or 1 to 2 weeks prior to appointment.

## 2020-03-06 NOTE — Telephone Encounter (Signed)
[facility] Patient Advocate Encounter  Completed and sent [employer] Advancing Access application for Biktarvy for this patient who is uninsured.    Patient is approved 03/06/20 through 02/07/21.  BIN      [id number]  PCN    [id number] [id number]    [id number] ID        [id number]   Clearance Coots, CPhT Specialty Pharmacy Patient [facility] Phone: [phone] Fax:  [phone]

## 2020-03-06 NOTE — Progress Notes (Signed)
Subjective:    Patient ID: Eric [name], male    DOB: 02/29/1990, 30 y.o.   MRN: [id number]  Chief Complaint  Patient presents with  . Follow-up    B20     HPI:  Eric [name] is a 30 y.o. male he had HIV disease last seen on 08/29/2019 with well-controlled virus and good adherence and tolerance to his ART regimen of Biktarvy.  Viral load at the time was undetectable with CD4 count of 761.  No recent blood work has been completed.  Here today for routine follow-up.  Eric [name] has been taking his Biktarvy daily as prescribed with no missed doses since his last office visit.  Overall feeling well today with concerns for new onset diarrhea of 3 weeks that started after a new diet change to the [facility] and then was placed on an unknown antibiotic.  Currently having 3-4 bowel movements per day with occasional abdominal cramping. Has not tried any treatments to this point.  Stools are loose to liquid and vary throughout the day.  Denies fevers, chills, night sweats, headaches, changes in vision, neck pain/stiffness, nausea, vomiting, lesions or rashes.  Eric [name] lost his insurance at the end of December and has not been able to obtain a refill but continues to have medication. Denies feelings of being down, depressed or hopeless recently.  Continues to smoke marijuana 2-3 times per week and drinks alcohol on occasion.  Remains a light tobacco smoker.  Declines condoms.   No Known Allergies    Outpatient Medications Prior to Visit  Medication Sig Dispense Refill  . bictegravir-emtricitabine-tenofovir AF (BIKTARVY) 50-200-25 MG TABS tablet Take 1 tablet by mouth daily. 30 tablet 6  . dorzolamide-timolol (COSOPT) 22.3-6.8 MG/ML ophthalmic solution INT 1 GTT IN OS BID UTD (Patient not taking: Reported on 03/06/2020)     No facility-administered medications prior to visit.     Past Medical History:  Diagnosis Date  . Depression 09/22/2016  . Glaucoma    currently untreated   . HIV  (human immunodeficiency virus infection) (HCC) 09/22/2016     Past Surgical History:  Procedure Laterality Date  . NO PAST SURGERIES         Review of Systems  Constitutional: Negative for appetite change, chills, fatigue, fever and unexpected weight change.  Eyes: Negative for visual disturbance.  Respiratory: Negative for cough, chest tightness, shortness of breath and wheezing.   Cardiovascular: Negative for chest pain and leg swelling.  Gastrointestinal: Positive for diarrhea. Negative for abdominal pain, constipation, nausea and vomiting.  Genitourinary: Negative for dysuria, flank pain, frequency, genital sores, hematuria and urgency.  Skin: Negative for rash.  Allergic/Immunologic: Negative for immunocompromised state.  Neurological: Negative for dizziness and headaches.      Objective:    BP 133/88   Pulse 67   Temp 98 F (36.7 C) (Oral)   Wt 287 lb (130.2 kg)   BMI 37.87 kg/m  Nursing note and vital signs reviewed.  Physical Exam Constitutional:      General: He is not in acute distress.    Appearance: He is well-developed and well-nourished.  Cardiovascular:     Rate and Rhythm: Normal rate and regular rhythm.     Pulses: Intact distal pulses.     Heart sounds: Normal heart sounds.  Pulmonary:     Effort: Pulmonary effort is normal.     Breath sounds: Normal breath sounds.  Skin:    General: Skin is warm and dry.  Neurological:  Mental Status: He is alert and oriented to person, place, and time.  Psychiatric:        Mood and Affect: Mood and affect normal.        Behavior: Behavior normal.        Thought Content: Thought content normal.        Judgment: Judgment normal.      Depression screen [facility] 2/9 03/06/2020 01/30/2019 11/21/2017 07/19/2017 03/24/2017  Decreased Interest 0 0 0 0 0  Down, Depressed, Hopeless 0 0 0 0 0  PHQ - 2 Score 0 0 0 0 0       Assessment & Plan:    Patient Active Problem List   Diagnosis Date Noted  . Diarrhea  03/06/2020  . S/P thoracentesis   . Sepsis due to Streptococcus, group A (HCC)   . Bacteremia   . Multifocal pneumonia 12/21/2016  . AKI (acute kidney injury) (HCC) 12/21/2016  . Hypotension 12/21/2016  . HIV (human immunodeficiency virus infection) (HCC) 09/22/2016  . Healthcare maintenance 09/22/2016  . Depression 09/22/2016     Problem List Items Addressed This Visit      Other   HIV (human immunodeficiency virus infection) (HCC) - Primary (Chronic)    Eric [name] continues to have well-controlled HIV disease with good adherence and tolerance to his ART regimen of Biktarvy.  No signs/symptoms of opportunistic infection or progressive HIV disease.  Diarrhea unlikely related to HIV although cannot be ruled out.  Check blood work today.  Continue current dose of Biktarvy.  Plan for follow-up in 3 months or sooner if needed with lab work on the same day or 1 to 2 weeks prior to appointment.      Relevant Orders   COMPLETE METABOLIC PANEL WITH GFR   CBC w/Diff   HIV-1 RNA quant-no reflex-bld   T-helper cell (CD4)- ([facility] clinic only)   Healthcare maintenance     Discussed importance of safe sexual practice to reduce risk of STI.  Condoms declined.  Declines flu shot.  Discussed/recommended Covid booster when eligible next month.       Diarrhea    Eric [name] has acute onset diarrhea of unclear origin although likely related to changes in diet and possibly antibiotic use. Does not sound consistent with C. Diff. Recommend immodium and increasing fiber first. If no improvement will test C. Diff.        Other Visit Diagnoses    Screening for STDs (sexually transmitted diseases)       Relevant Orders   RPR       I am having Eric [name] maintain his dorzolamide-timolol and Biktarvy.    Follow-up: Return in about 3 months (around 06/04/2020), or if symptoms worsen or fail to improve.   Marcos Eke, MSN, FNP-C Nurse Practitioner [facility] for Infectious  Disease [facility] Main number: [phone]

## 2020-03-07 LAB — T-HELPER CELL (CD4) - (RCID CLINIC ONLY)
CD4 % Helper T Cell: 39 % (ref 33–65)
CD4 T Cell Abs: 684 /uL (ref 400–1790)

## 2020-03-09 LAB — CBC WITH DIFFERENTIAL/PLATELET
Absolute Monocytes: 299 {cells}/uL (ref 200–950)
Basophils Absolute: 50 {cells}/uL (ref 0–200)
Basophils Relative: 1.4 %
Eosinophils Absolute: 119 {cells}/uL (ref 15–500)
Eosinophils Relative: 3.3 %
HCT: 43.9 % (ref 38.5–50.0)
Hemoglobin: 15.2 g/dL (ref 13.2–17.1)
Lymphs Abs: 1811 {cells}/uL (ref 850–3900)
MCH: 32.8 pg (ref 27.0–33.0)
MCHC: 34.6 g/dL (ref 32.0–36.0)
MCV: 94.6 fL (ref 80.0–100.0)
MPV: 9.8 fL (ref 7.5–12.5)
Monocytes Relative: 8.3 %
Neutro Abs: 1321 {cells}/uL — ABNORMAL LOW (ref 1500–7800)
Neutrophils Relative %: 36.7 %
Platelets: 330 Thousand/uL (ref 140–400)
RBC: 4.64 Million/uL (ref 4.20–5.80)
RDW: 12.2 % (ref 11.0–15.0)
Total Lymphocyte: 50.3 %
WBC: 3.6 Thousand/uL — ABNORMAL LOW (ref 3.8–10.8)

## 2020-03-09 LAB — COMPLETE METABOLIC PANEL WITH GFR
AG Ratio: 1.4 (calc) (ref 1.0–2.5)
ALT: 21 U/L (ref 9–46)
AST: 20 U/L (ref 10–40)
Albumin: 4.1 g/dL (ref 3.6–5.1)
Alkaline phosphatase (APISO): 48 U/L (ref 36–130)
BUN: 18 mg/dL (ref 7–25)
CO2: 27 mmol/L (ref 20–32)
Calcium: 9.1 mg/dL (ref 8.6–10.3)
Chloride: 106 mmol/L (ref 98–110)
Creat: 1.09 mg/dL (ref 0.60–1.35)
GFR, Est African American: 107 mL/min/{1.73_m2} (ref >=60)
GFR, Est Non African American: 93 mL/min/{1.73_m2} (ref >=60)
Globulin: 3 g/dL (calc) (ref 1.9–3.7)
Glucose, Bld: 85 mg/dL (ref 65–99)
Potassium: 4.5 mmol/L (ref 3.5–5.3)
Sodium: 138 mmol/L (ref 135–146)
Total Bilirubin: 0.5 mg/dL (ref 0.2–1.2)
Total Protein: 7.1 g/dL (ref 6.1–8.1)

## 2020-03-09 LAB — RPR: RPR Ser Ql: NONREACTIVE

## 2020-03-09 LAB — HIV-1 RNA QUANT-NO REFLEX-BLD
HIV 1 RNA Quant: <20 Copies/mL
HIV-1 RNA Quant, Log: <1.3 Log cps/mL

## 2020-04-03 MED FILL — BIKTARVY 50-200-25 MG TABS: 50-200-25 | 30 days supply | Qty: 30 | Fill #1

## 2020-04-30 MED FILL — BIKTARVY 50-200-25 MG TABS: 50-200-25 | 30 days supply | Qty: 30 | Fill #2

## 2020-05-08 ENCOUNTER — Other Ambulatory Visit (HOSPITAL_COMMUNITY): Payer: Self-pay

## 2020-05-27 ENCOUNTER — Other Ambulatory Visit (HOSPITAL_COMMUNITY): Payer: Self-pay

## 2020-05-27 ENCOUNTER — Other Ambulatory Visit: Payer: Self-pay | Admitting: Family

## 2020-05-27 DIAGNOSIS — Z21 Asymptomatic human immunodeficiency virus [HIV] infection status: Secondary | ICD-10-CM

## 2020-05-27 MED ORDER — BIKTARVY 50-200-25 MG PO TABS
1.0000 | ORAL_TABLET | Freq: Every day | ORAL | 2 refills | Status: DC
  Filled 2020-05-27: qty 30, 30d supply, fill #0

## 2020-05-29 ENCOUNTER — Other Ambulatory Visit (HOSPITAL_COMMUNITY): Payer: Self-pay

## 2020-06-05 ENCOUNTER — Ambulatory Visit (INDEPENDENT_AMBULATORY_CARE_PROVIDER_SITE_OTHER): Payer: Self-pay | Admitting: Internal Medicine

## 2020-06-05 ENCOUNTER — Other Ambulatory Visit: Payer: Self-pay

## 2020-06-05 ENCOUNTER — Encounter: Payer: Self-pay | Admitting: Internal Medicine

## 2020-06-05 DIAGNOSIS — R197 Diarrhea, unspecified: Secondary | ICD-10-CM

## 2020-06-05 DIAGNOSIS — Z21 Asymptomatic human immunodeficiency virus [HIV] infection status: Secondary | ICD-10-CM

## 2020-06-05 DIAGNOSIS — F329 Major depressive disorder, single episode, unspecified: Secondary | ICD-10-CM

## 2020-06-05 MED ORDER — BIKTARVY 50-200-25 MG PO TABS: 1.0000 | ORAL_TABLET | Freq: Every day | ORAL | 11 refills | Status: DC

## 2020-06-05 NOTE — Assessment & Plan Note (Signed)
Rea resolved once he cut down on his alcohol intake.

## 2020-06-05 NOTE — Assessment & Plan Note (Signed)
His depression and anxiety are in remission. 

## 2020-06-05 NOTE — Patient Instructions (Signed)
For primary care, please call either [facility] [phone]  - Internal Medicine [phone]

## 2020-06-05 NOTE — Assessment & Plan Note (Signed)
His adherence with Susanne Borders is very good and his infection has been under excellent, long-term control.  He will get repeat lab work today, continue [place] and follow-up here in 6 months unless he has moved.  How to find an HIV provider if he does move.  Encouraged him to go ahead and get his COVID booster vaccine.  I gave him a small pill canisters so he can take 1 or 2 doses of Biktarvy to work with him in case he forgets to take it each morning at home.

## 2020-06-05 NOTE — Progress Notes (Signed)
Patient Active Problem List   Diagnosis Date Noted  . Diarrhea 03/06/2020  . S/P thoracentesis   . Sepsis due to Streptococcus, group A (HCC)   . Bacteremia   . Multifocal pneumonia 12/21/2016  . AKI (acute kidney injury) (HCC) 12/21/2016  . Hypotension 12/21/2016  . HIV (human immunodeficiency virus infection) (HCC) 09/22/2016  . Healthcare maintenance 09/22/2016  . Depression 09/22/2016    Patient's Medications  New Prescriptions   No medications on file  Previous Medications   DORZOLAMIDE-TIMOLOL (COSOPT) 22.3-6.8 MG/ML OPHTHALMIC SOLUTION      Modified Medications   Modified Medication Previous Medication   BICTEGRAVIR-EMTRICITABINE-TENOFOVIR AF (BIKTARVY) 50-200-25 MG TABS TABLET bictegravir-emtricitabine-tenofovir AF (BIKTARVY) 50-200-25 MG TABS tablet      TAKE 1 TABLET BY MOUTH DAILY.    TAKE 1 TABLET BY MOUTH DAILY.  Discontinued Medications   No medications on file    Subjective: Norvell is in for his routine HIV follow-up visit.  He denies any problems obtaining, taking or tolerating his Biktarvy.  He generally takes it each morning but occasionally will forget and take it when he gets home at night from work.  He recalls missing 1 dose in the past month when he simply got busy and forgot.  He is not on any other medications.  He is not feeling anxious or depressed.  His diarrhea is much improved.  After his last visit here he noticed that when he was drinking alcohol heavily he would have diarrhea.  He has cut back on his drinking and feels like that has helped.  He has been offered a job in [place] and is considering moving there.  He has questions about how he can establish care if he does move.  He had a second Pfizer COVID-vaccine in mid October of last year.  Review of Systems: Review of Systems  Constitutional: Negative for chills, diaphoresis, fever and weight loss.  Respiratory: Negative for cough.   Cardiovascular: Negative for chest pain.   Gastrointestinal: Negative for abdominal pain, diarrhea, nausea and vomiting.  Psychiatric/Behavioral: Negative for depression. The patient is not nervous/anxious.     Past Medical History:  Diagnosis Date  . Depression 09/22/2016  . Glaucoma    currently untreated   . HIV (human immunodeficiency virus infection) (HCC) 09/22/2016    Social History   Tobacco Use  . Smoking status: Former Games developer  . Smokeless tobacco: Never Used  Vaping Use  . Vaping Use: Never used  Substance Use Topics  . Alcohol use: Yes    Comment: social  . Drug use: Not Currently    Types: Marijuana    Comment: 2-3x per week    Family History  Problem Relation Age of Onset  . Hypertension Father   . Diabetes Father   . Healthy Brother   . Hypertension Maternal Grandmother   . Kidney disease Maternal Grandmother   . Glaucoma Cousin     No Known Allergies  Health Maintenance  Topic Date Due  . COVID-19 Vaccine (3 - Pfizer risk 4-dose series) 12/27/2019  . INFLUENZA VACCINE  09/08/2020  . TETANUS/TDAP  07/28/2021  . HPV VACCINES  Completed  . Hepatitis C Screening  Completed  . HIV Screening  Completed    Objective:  Vitals:   06/05/20 1134  BP: (!) 142/90  Pulse: 75  Temp: 97.6 F (36.4 C)  TempSrc: Oral  SpO2: 98%  Weight: (!) 302 lb (137 kg)  Height: 6\' 1"  (1.854  m)   Body mass index is 39.84 kg/m.  Physical Exam Constitutional:      Comments: He is in good spirits.  Cardiovascular:     Rate and Rhythm: Normal rate and regular rhythm.     Heart sounds: No murmur heard.   Pulmonary:     Effort: Pulmonary effort is normal.     Breath sounds: Normal breath sounds.  Abdominal:     Palpations: Abdomen is soft.     Tenderness: There is no abdominal tenderness.  Psychiatric:        Mood and Affect: Mood normal.     Lab Results Lab Results  Component Value Date   WBC 3.6 (L) 03/06/2020   HGB 15.2 03/06/2020   HCT 43.9 03/06/2020   MCV 94.6 03/06/2020   PLT 330  03/06/2020    Lab Results  Component Value Date   CREATININE 1.09 03/06/2020   BUN 18 03/06/2020   NA 138 03/06/2020   K 4.5 03/06/2020   CL 106 03/06/2020   CO2 27 03/06/2020    Lab Results  Component Value Date   ALT 21 03/06/2020   AST 20 03/06/2020   ALKPHOS 103 12/20/2016   BILITOT 0.5 03/06/2020    Lab Results  Component Value Date   CHOL 171 08/20/2019   HDL 67 08/20/2019   LDLCALC 87 08/20/2019   TRIG 77 08/20/2019   CHOLHDL 2.6 08/20/2019   Lab Results  Component Value Date   LABRPR NON-REACTIVE 03/06/2020   HIV 1 RNA Quant  Date Value  03/06/2020 <20 Copies/mL  08/20/2019 <20 NOT DETECTED copies/mL  01/09/2019 <20 NOT DETECTED copies/mL   CD4 T Cell Abs (/uL)  Date Value  03/06/2020 684  08/20/2019 761  01/09/2019 574     Problem List Items Addressed This Visit      Unprioritized   HIV (human immunodeficiency virus infection) (HCC) (Chronic)    His adherence with Biktarvy is very good and his infection has been under excellent, long-term control.  He will get repeat lab work today, continue [place] and follow-up here in 6 months unless he has moved.  How to find an HIV provider if he does move.  Encouraged him to go ahead and get his COVID booster vaccine.  I gave him a small pill canisters so he can take 1 or 2 doses of Biktarvy to work with him in case he forgets to take it each morning at home.      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      Diarrhea    Rea resolved once he cut down on his alcohol intake.       Other Visit Diagnoses    Asymptomatic HIV infection (HCC)       Relevant Medications   bictegravir-emtricitabine-tenofovir AF (BIKTARVY) 50-200-25 MG TABS tablet        Cliffton Asters, MD [facility]  [facility]fectious Disease [facility] [phone] pager   [phone] cell 06/05/2020, 12:00 PM

## 2020-06-06 LAB — T-HELPER CELL (CD4) - (RCID CLINIC ONLY)
CD4 % Helper T Cell: 44 % (ref 33–65)
CD4 T Cell Abs: 906 /uL (ref 400–1790)

## 2020-06-08 LAB — HIV-1 RNA QUANT-NO REFLEX-BLD
HIV 1 RNA Quant: NOT DETECTED Copies/mL
HIV-1 RNA Quant, Log: NOT DETECTED Log cps/mL

## 2020-06-24 ENCOUNTER — Other Ambulatory Visit: Payer: Self-pay | Admitting: Family

## 2020-06-24 ENCOUNTER — Other Ambulatory Visit (HOSPITAL_COMMUNITY): Payer: Self-pay

## 2020-06-24 DIAGNOSIS — Z21 Asymptomatic human immunodeficiency virus [HIV] infection status: Secondary | ICD-10-CM

## 2020-06-25 ENCOUNTER — Other Ambulatory Visit (HOSPITAL_COMMUNITY): Payer: Self-pay

## 2020-06-26 ENCOUNTER — Other Ambulatory Visit (HOSPITAL_COMMUNITY): Payer: Self-pay

## 2020-06-30 ENCOUNTER — Other Ambulatory Visit (HOSPITAL_COMMUNITY): Payer: Self-pay

## 2020-07-29 ENCOUNTER — Other Ambulatory Visit (HOSPITAL_COMMUNITY): Payer: Self-pay

## 2020-07-30 ENCOUNTER — Other Ambulatory Visit (HOSPITAL_COMMUNITY): Payer: Self-pay

## 2020-09-02 NOTE — Progress Notes (Signed)
Patient ID: Eric [name], male   DOB: 09-22-1990, 30 y.o.   MRN: [id number] Patient [name] to advise he has moved to [facility]  and will be looking for an Infectious Disease Office there

## 2020-10-27 ENCOUNTER — Other Ambulatory Visit (HOSPITAL_COMMUNITY): Payer: Self-pay

## 2020-10-28 ENCOUNTER — Other Ambulatory Visit: Payer: Self-pay

## 2020-10-28 DIAGNOSIS — Z21 Asymptomatic human immunodeficiency virus [HIV] infection status: Secondary | ICD-10-CM

## 2020-10-28 MED ORDER — BIKTARVY 50-200-25 MG PO TABS: 1.0000 | ORAL_TABLET | Freq: Every day | ORAL | 1 refills | Status: AC

## 2020-12-11 ENCOUNTER — Ambulatory Visit: Payer: Self-pay | Admitting: Internal Medicine
